# Patient Record
Sex: Male | Born: 1995 | Race: White | Hispanic: No | Marital: Single | Smoking: Never smoker
Health system: Southern US, Community
[De-identification: ages and names within clinical notes are randomized; demographics above are authoritative.]

## PROBLEM LIST (undated history)

## (undated) DIAGNOSIS — F1910 Other psychoactive substance abuse, uncomplicated: Secondary | ICD-10-CM

## (undated) DIAGNOSIS — Q0701 Arnold-Chiari syndrome with spina bifida: Secondary | ICD-10-CM

## (undated) DIAGNOSIS — E119 Type 2 diabetes mellitus without complications: Secondary | ICD-10-CM

## (undated) DIAGNOSIS — F1020 Alcohol dependence, uncomplicated: Secondary | ICD-10-CM

## (undated) HISTORY — PX: TONSILLECTOMY AND ADENOIDECTOMY: SUR1326

## (undated) HISTORY — DX: Arnold-Chiari syndrome with spina bifida: Q07.01

## (undated) HISTORY — DX: Type 2 diabetes mellitus without complications: E11.9

---
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
## 2014-01-03 ENCOUNTER — Other Ambulatory Visit (HOSPITAL_COMMUNITY): Payer: BC Managed Care – PPO | Admitting: Psychology

## 2014-01-03 ENCOUNTER — Ambulatory Visit (INDEPENDENT_AMBULATORY_CARE_PROVIDER_SITE_OTHER): Payer: BC Managed Care – PPO | Admitting: Sports Medicine

## 2014-01-03 ENCOUNTER — Encounter: Payer: Self-pay | Admitting: Sports Medicine

## 2014-01-03 VITALS — BP 113/68 | HR 85 | Ht 68.0 in | Wt 187.0 lb

## 2014-01-03 DIAGNOSIS — F341 Dysthymic disorder: Secondary | ICD-10-CM | POA: Diagnosis not present

## 2014-01-03 DIAGNOSIS — F329 Major depressive disorder, single episode, unspecified: Secondary | ICD-10-CM

## 2014-01-03 DIAGNOSIS — Z111 Encounter for screening for respiratory tuberculosis: Secondary | ICD-10-CM | POA: Diagnosis not present

## 2014-01-03 DIAGNOSIS — Z0100 Encounter for examination of eyes and vision without abnormal findings: Secondary | ICD-10-CM | POA: Diagnosis not present

## 2014-01-03 DIAGNOSIS — Z299 Encounter for prophylactic measures, unspecified: Secondary | ICD-10-CM | POA: Insufficient documentation

## 2014-01-03 DIAGNOSIS — F1120 Opioid dependence, uncomplicated: Secondary | ICD-10-CM | POA: Diagnosis not present

## 2014-01-03 DIAGNOSIS — F32A Depression, unspecified: Secondary | ICD-10-CM

## 2014-01-03 DIAGNOSIS — E1069 Type 1 diabetes mellitus with other specified complication: Secondary | ICD-10-CM

## 2014-01-03 DIAGNOSIS — E109 Type 1 diabetes mellitus without complications: Secondary | ICD-10-CM

## 2014-01-03 DIAGNOSIS — F419 Anxiety disorder, unspecified: Secondary | ICD-10-CM

## 2014-01-03 LAB — POCT URINALYSIS DIPSTICK
Bilirubin, UA: NEGATIVE
Blood, UA: NEGATIVE
Glucose, UA: 500
Ketones, UA: NEGATIVE
Leukocytes, UA: NEGATIVE
Nitrite, UA: NEGATIVE
Protein, UA: NEGATIVE
Spec Grav, UA: 1.02
Urobilinogen, UA: 0.2
pH, UA: 6

## 2014-01-03 NOTE — Assessment & Plan Note (Signed)
Back on insulin pump. Management by endocrinology.

## 2014-01-03 NOTE — Progress Notes (Signed)
  Subjective:    CC: Followup  HPI: Dalton Jackson who likes to be called [name] is a very pleasant 18 year old male, we have been treating him for anxiety and depression with clonazepam. Unfortunately he was recently pulled over and arrested for a DUI, he had been obtaining oxycodone and Opana on the streets. He is now in outpatient intensive rehabilitation for drug and abuse. He is agreeable to seeing our psychiatrist downstairs for further treatment of his depression, he has been on nortriptyline, bupropion, and we tried using around without any improvement. He understands that we will not be prescribing any further controlled substances, and we will be down tapering his clonazepam.  Diabetes mellitus type 1: Back to the insulin pump, managed by endocrinology.  Past medical history, Surgical history, Family history not pertinant except as noted below, Social history, Allergies, and medications have been entered into the medical record, reviewed, and no changes needed.   Review of Systems: No fevers, chills, night sweats, weight loss, chest pain, or shortness of breath.   Objective:    General: Well Developed, well nourished, and in no acute distress.  Neuro: Alert and oriented x3, extra-ocular muscles intact, sensation grossly intact.  HEENT: Normocephalic, atraumatic, pupils equal round reactive to light, neck supple, no masses, no lymphadenopathy, thyroid nonpalpable.  Skin: Warm and dry, no rashes. Cardiac: Regular rate and rhythm, no murmurs rubs or gallops, no lower extremity edema.  Respiratory: Clear to auscultation bilaterally. Not using accessory muscles, speaking in full sentences.  Impression and Recommendations:

## 2014-01-03 NOTE — Assessment & Plan Note (Signed)
Discontinuing clonazepam. Referral downstairs for anxiety/depression in light of recent opiate abuse.

## 2014-01-03 NOTE — Assessment & Plan Note (Addendum)
Forms filled out for school. Starting pharmacy school next month. Urinalysis as required by school form.

## 2014-01-04 ENCOUNTER — Encounter (HOSPITAL_COMMUNITY): Payer: Self-pay | Admitting: Psychology

## 2014-01-04 NOTE — Progress Notes (Signed)
    Daily Group Progress Note  Program: CD-IOP   Group Time: 1-2:30 pm  Participation Level: Active  Behavioral Response: Appropriate and Sharing  Type of Therapy: Process Group  Topic: Process/Psycho-Ed; the first part of group was spent in process. Members shared about the past weekend and events or situations that had challenged their sobriety. After checking-in, they also discussed the things they did to support their recovery. Halfway through the session, an explanation of the 4 elements that are involved in the development of addiction was provided. These are referred to the Bio-Psycho-Social-Spiritual elements. Only 1 of the 4 elements - the biological portion - cannot be changed. the remainder of the session was spent identifying how to address the other 3 elements in a way that supports and promotes sobriety.  Group Time: 2:45- 4pm  Participation Level: Minimal  Behavioral Response: Appropriate  Type of Therapy: Psycho-education Group  Topic: Psycho-Ed: Negative Core Beliefs. The second part of group was spent in a presentation on the development of negative core beliefs. A handout was provided. Members shared how some of their negative core beliefs may have developed. The new group members noted his father was a Programmer, multimediapreacher and a part of him believes it is a choice that he makes to smoke crack. He feels even worse about himself when he thinks that he may just be choosing to smoke versus the lack of control in active addiction. Another member reported she would worry about someone might think negatively of her and not risk certain things as a result of these fears. There was good feedback among members and I reminded them that these beliefs must eventually be identified and addressed because they fuel one's addiction.   Summary: The patient returned to group today after having missed group on Friday. He had attended an orientation at [employer], where he is scheduled to enter  in late August. When asked how the day had been, the patient reported it had been a really fun day. He explained that it had been great having the freedom to be with other young people and he anticipated starting school next month. He reminded the group that he is currently not allowed much freedom by his parents because of the DUI he had 2 weeks ago. He gets rides from his mother or his girlfriend. On Saturday he had attended [place] NA [place] in [place] and Sunday he went to church. 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 He admitted he hasn't really had anything bad happen although other group members disagreed with this observation. The patient reported he had been diagnosed with diabetes almost a year ago. He has an insulin pump as does another group member. The patient shared little of himself in the discussion on core beliefs. In fact, he seems to have a very good sense of self - perhaps a little inflated, to be exact, but we will address this issue in later sessions. In the meantime, the patient's sobriety date remains 7/18.    Family Program: Family present? No   Name of family member(s):   UDS collected: No Results:   AA/NA attended?: YesSaturday  Sponsor?: No, but he has only attended a few sessions   Ellarie Picking, LCAS

## 2014-01-05 ENCOUNTER — Other Ambulatory Visit (HOSPITAL_COMMUNITY): Payer: BC Managed Care – PPO | Admitting: Psychology

## 2014-01-05 DIAGNOSIS — E1069 Type 1 diabetes mellitus with other specified complication: Secondary | ICD-10-CM

## 2014-01-05 DIAGNOSIS — F1120 Opioid dependence, uncomplicated: Secondary | ICD-10-CM | POA: Diagnosis not present

## 2014-01-06 ENCOUNTER — Encounter (HOSPITAL_COMMUNITY): Payer: Self-pay | Admitting: Psychology

## 2014-01-06 LAB — TB SKIN TEST
Induration: 0 mm
TB Skin Test: NEGATIVE

## 2014-01-06 NOTE — Progress Notes (Signed)
    Daily Group Progress Note  Program: CD-IOP   Group Time: 1-2:30 pm  Participation Level: Active  Behavioral Response: Appropriate and Sharing  Type of Therapy: Process Group  Topic: Process/Dry Drunk: the first part of the session today was spent in process and a psycho-ed. Members shared about what they have done since the last group session.  One member checked-in with a new sobriety date and we processed his relapse. The emphasis was not on shaming this member, but providing everyone with further insight into red flags signaling possible relapse. During group today, the Investment banker, operational met with 2 new group members for their initial sessions with him and a discharge. A handout defining a "Dry Drunk" was provided and members read over the handout together. At the conclusion of this part of group, everyone agreed they understood what a dry drunk was.   Group Time: 2:45- 4pm  Participation Level: Active  Behavioral Response: Sharing  Type of Therapy: Activity/Graduation  Topic: Five Love Languages/Graduation: the second half of group was spent in an activity. Members completed a handout and later offered their ranking relative to the well-known book, The Five Love Languages. The importance of identifying and knowing how one most feels loved was emphasized. This is also true for an S/O or close relationships. None of the members had ever done the exercise and they all seemed to find it enlightening. At the conclusion of group a graduation ceremony was held for a member leaving the program today. Kind words of hope and gratitude were shared and the graduating member encouraged them to make the most of the opportunity this program provides.   Summary: The patient reported he had attended a meeting since the last group. His girlfriend had gone to the [place] today and would be gone a week. He admitted he was frustrated because his parents don't let him do anything. They drive  him to meetings or here, but otherwise, he has to stay at his house. Another member referred to his captivity like being in "a cage'. Other members agreed that they couldn't stand that sort of thing and one member, the same age as this young man, admitted he had gotten mad and basically threatened his parents if they didn't give him a little more freedom. This patient reported he couldn't possibly do that to his father. During the conversation on dry drunk, the patient admitted he was "restless, irritable, and discontent", the very words listed on the handout. I encouraged him to speak with his parents and consider a reasonable agreement for both parties. I also agreed to phone his parents and speak with them. The patient seemed pleased to hear this. In the second half of group, the patient identified his love languages as quality time and physical touch. He agreed that he would get his girlfriend to take the test when she returns from vacation. The patient wished the graduating member well and expressed hope that he would see her in meetings in the future. The patient is doing well, but it remains unclear whether he is doing for himself or to please his parents. His sobriety date is 7/18.    Family Program: Family present? No   Name of family member(s):   UDS collected: Yes Results: not back yet  AA/NA attended?: YesTuesday  Sponsor?: No   Juno Bozard, LCAS

## 2014-01-07 ENCOUNTER — Other Ambulatory Visit (HOSPITAL_COMMUNITY): Payer: BC Managed Care – PPO | Admitting: Psychology

## 2014-01-07 DIAGNOSIS — E1069 Type 1 diabetes mellitus with other specified complication: Secondary | ICD-10-CM

## 2014-01-07 DIAGNOSIS — F1120 Opioid dependence, uncomplicated: Secondary | ICD-10-CM | POA: Diagnosis not present

## 2014-01-10 ENCOUNTER — Encounter (HOSPITAL_COMMUNITY): Payer: Self-pay | Admitting: Psychology

## 2014-01-10 ENCOUNTER — Other Ambulatory Visit (HOSPITAL_COMMUNITY): Payer: BC Managed Care – PPO | Attending: Psychiatry | Admitting: Psychology

## 2014-01-10 DIAGNOSIS — Z794 Long term (current) use of insulin: Secondary | ICD-10-CM | POA: Insufficient documentation

## 2014-01-10 DIAGNOSIS — F1010 Alcohol abuse, uncomplicated: Secondary | ICD-10-CM | POA: Insufficient documentation

## 2014-01-10 DIAGNOSIS — E119 Type 2 diabetes mellitus without complications: Secondary | ICD-10-CM | POA: Insufficient documentation

## 2014-01-10 DIAGNOSIS — F1120 Opioid dependence, uncomplicated: Secondary | ICD-10-CM | POA: Diagnosis not present

## 2014-01-10 DIAGNOSIS — F1210 Cannabis abuse, uncomplicated: Secondary | ICD-10-CM | POA: Diagnosis not present

## 2014-01-10 DIAGNOSIS — F341 Dysthymic disorder: Secondary | ICD-10-CM | POA: Insufficient documentation

## 2014-01-10 DIAGNOSIS — E1069 Type 1 diabetes mellitus with other specified complication: Secondary | ICD-10-CM

## 2014-01-10 NOTE — Progress Notes (Signed)
    Daily Group Progress Note  Program: CD-IOP   Group Time: 1-2:30 pm  Participation Level: Active  Behavioral Response: Appropriate and Sharing  Type of Therapy: Process Group  Topic: Process; the first part of group was spent in process. Members shared about current issues and concerns. They also disclosed the things they had done to support their recovery, including meetings and speaking with their sponsor. The importance of attendance and being here for one's self and others was emphasized. This was pointed out in light of a few absences and disappearances by, apparently, "former" group members.   Group Time: 2:45- 4pm  Participation Level: Active  Behavioral Response: Appropriate  Type of Therapy: Psycho-education Group  Topic: Emotional Buttons: the second half of group was spent in a psycho-ed. A handout was provided identifying different types of "buttons" that people might have. All of the 'buttons" listed deal with negative feelings that are generated when the "button" is pushed. The discussion was lively with good feedback and disclosure among group members.   Summary: The patient reported that he had driven to group today. The group applauded this news. He pointed out that the session he and his parents had had with me on Wednesday had been very helpful and got a lot of things out in the open. He had also driven to the [place] meeting, [place], last night. When I asked the group how this fellow could begin to regain his parent's trust, they all agreed that doing what he says he is going to do is essential. If he says he will be home at 11:30 he must be home or call them immediately. The patient seemed to be in total agreement with these observations. The patient reported he had met with a pastor yesterday and talked with him. The pastor had been addicted to cocaine, but has now attained over 20 years of sobriety. Another friend of his father's had driven him to a methadone  clinic and they had just sat outside in the car and watched the coming and goings. It had been eye-opening, according to this patient. In the psycho-ed, the patient identified his biggest button as the "inadequacy button". He admitted he works hard to overcome these feelings. Another member pointed out that he does this frequently in group when he offers complicated obscure information about certain topics, especially as relate to medical or bodily things. The patient also pointed out that "control" is another one of his buttons. He admitted sometimes he tries to get what he knows he cannot get, but if he does get it, he doesn't care about it anymore. The patient was open and talkative today. He was very pleased about this new freedom his parents have given him, but the importance of following through with this new accountability, was emphasized. He is attending meetings and reported he hopes to secure a sponsor this weekend. His sobriety date is 7/18.  Family Program: Family present? No   Name of family member(s):   UDS collected: No Results:  AA/NA attended?: YesWednesday and Thursday  Sponsor?: No, but he has someone in mind to ask   Jaquay Morneault, LCAS

## 2014-01-12 ENCOUNTER — Other Ambulatory Visit (HOSPITAL_COMMUNITY): Payer: BC Managed Care – PPO | Admitting: Psychology

## 2014-01-12 DIAGNOSIS — E1069 Type 1 diabetes mellitus with other specified complication: Secondary | ICD-10-CM

## 2014-01-12 DIAGNOSIS — F1120 Opioid dependence, uncomplicated: Secondary | ICD-10-CM

## 2014-01-13 ENCOUNTER — Encounter (HOSPITAL_COMMUNITY): Payer: Self-pay | Admitting: Psychology

## 2014-01-13 NOTE — Progress Notes (Signed)
    Daily Group Progress Note  Program: CD-IOP   Group Time: 1-2:30 pm  Participation Level: Active  Behavioral Response: Appropriate and Sharing  Type of Therapy: Process Group  Topic: Process: the first part of group was spent in process. Members shared about what had occurred since we last met. During check-in, a patient identified a new sobriety date. This relapse was explored further with good feedback and discussion among group members. During this half of group, one member admitted another member's comments were very frustrating and really bothering her. I applauded her willingness to be honest - he had made distracting comments and interrupted repeatedly. The group discussed his behaviors, but whether he 'heard' these observations, remains to be seen.   Group Time: 2:45- 4pm  Participation Level: Active  Behavioral Response: Sharing  Type of Therapy: Psycho-education Group  Topic: Healthy Boundaries: the second half of group was spent in a psycho-ed. The topic of the presentation was on establishing healthy boundaries. These are particularly important in early recovery, when whatever boundaries one might have had in sobriety were abandoned with active addiction. There was frank disclosures and group members shared openly about their regrets of the past. At the end of the session, two members were asked to 'teach back'. Both made good comments and displayed a good understanding of the presentation.   Summary: The patient checked-in with a new sobriety date. He reported a date of 7/13. Members picked up on this discrepancy very quickly. When asked to disclose what had happened, the patient reported he had had a beer on Sunday evening. He admitted he hadn't shared this with the group on Monday, but realized he had to be honest today. The group processed this relapse and there were some details that were troubling. For example, the woman whose home he was in had not been told by him  of his recovery status. Instead of refusing the offer, he accepted the beer easily. Other members provided feedback and most of their comments reflected their unwillingness to be around other people that either aren't in recovery or don't know they are. The patient took the comments well and agreed he could have handled it differently. In the psycho-ed, the patient provided an exampled of a previous relationship where they were very close and then breaking up. Then the sequence would be repeated over and over again. The patient asked about some other earlier meetings and another group member invited him to meet him at his noon NA meeting tomorrow. I had challenged him and pointed out he was going to meetings late - 10 or 11 pm - and I wondered what he does all day. The patient really doesn't do much of anything, but sleep and sit around the house. He does not seem fully invested into some of the basic recovery concepts and his sobriety date is new with a 7/13 date. A specimen was collected from this patient today.    Family Program: Family present? No   Name of family member(s):   UDS collected: Yes Results: not back yet  AA/NA attended?: [place] and Tuesday  Sponsor?: No   Brynn Mulgrew, LCAS

## 2014-01-14 ENCOUNTER — Other Ambulatory Visit (HOSPITAL_COMMUNITY): Payer: BC Managed Care – PPO | Admitting: Psychology

## 2014-01-14 DIAGNOSIS — F1120 Opioid dependence, uncomplicated: Secondary | ICD-10-CM | POA: Diagnosis not present

## 2014-01-14 DIAGNOSIS — F32A Depression, unspecified: Secondary | ICD-10-CM

## 2014-01-14 DIAGNOSIS — E1069 Type 1 diabetes mellitus with other specified complication: Secondary | ICD-10-CM

## 2014-01-14 DIAGNOSIS — F411 Generalized anxiety disorder: Secondary | ICD-10-CM

## 2014-01-14 DIAGNOSIS — F329 Major depressive disorder, single episode, unspecified: Secondary | ICD-10-CM

## 2014-01-14 MED ORDER — ESCITALOPRAM OXALATE 10 MG PO TABS: 10.0000 mg | ORAL_TABLET | Freq: Every day | ORAL | Status: DC

## 2014-01-14 MED ORDER — PROPRANOLOL HCL 10 MG PO TABS: 10.0000 mg | ORAL_TABLET | Freq: Two times a day (BID) | ORAL | Status: DC

## 2014-01-14 NOTE — Progress Notes (Signed)
Patient ID: Dalton HeinzJonathon G Jackson, male   DOB: 04-22-1996, 18 y.o.   MRN: [id number]  S-Pt seeking to try medications originally discussed on intake.No longer expressing desire to pursue natural substitutes.State he spoke with his GF who is a Teacher, early years/prepharmacist and is willing to try meds. O Alert Oriented Appropriate Comprehensible Limited insight A- Hx of depression with anxiety prior to substance abuse/chemical dependency P-Rx Inderal 10 mg and Lexapro 10 mg done electronically and script written as backup

## 2014-01-15 ENCOUNTER — Encounter (HOSPITAL_COMMUNITY): Payer: Self-pay | Admitting: Psychology

## 2014-01-15 NOTE — Progress Notes (Signed)
    Daily Group Progress Note  Program: CD-IOP   Group Time: 1-2:30 pm  Participation Level: Active  Behavioral Response: Appropriate and Sharing  Type of Therapy: Process Group  Topic: Process/Psycho-Ed: the first half of group was spent in process with members updating the group on any events or issues that have appeared over the weekend One member shared about the session with his S/O and myself earlier this morning. A new member was present and she shared a little about herself. The session continued with a slide show from the [employer] on "Triggers and Cravings". the presentation educated members on how triggers are developed as the addiction strengthens. There was good feedback and discussion during the show.  Group Time: 2:45-4pm  Participation Level: Active  Behavioral Response: Sharing  Type of Therapy: Psycho-education Group  Topic: Deactivating Cravings: a handout was provided as a follow up to the slide show completed in the first part of group. A scenario about a fellow who had relapsed over the course of the day was read by members. The group was challenged to identify how and where this character had gone wrong. Members displayed good insight and understanding of the development of cravings. The discussion included examples from their own lives and past drug use and relapse.    Summary: The patient reported he was doing well. He had had a good weekend and had attended 3 meetings. He hadn't seen the guy he wanted for a sponsor, but he insisted he was going to get one soon. He had also seen his giflfriend since she had left on vacation. The patient made some good comments during the slide show and could easily relate to the growing preoccupation with the drug as his use increased. He admitted that nothing else mattered. In the psycho-ed, the patient reported that the fellow in the handout had clearly been setting up a relapse. He noted all of the different things  he did and it seemed pretty obvious from his perspective. The patient continues to do everything we have asked of him and his sobriety date remains 7/18.  Family Program: Family present? No   Name of family member(s):   UDS collected: Yes Results: the drug test was defective and could not be tested.  AA/NA attended?: YesFriday, Saturday and Sunday  Sponsor?: No, but he says he is looking for one and has a sponsor in mind   Philipe Laswell, LCAS

## 2014-01-16 ENCOUNTER — Encounter (HOSPITAL_COMMUNITY): Payer: Self-pay | Admitting: Psychology

## 2014-01-16 NOTE — Progress Notes (Signed)
    Daily Group Progress Note  Program: CD-IOP   Group Time: 1-2:30 pm  Participation Level: Active  Behavioral Response: Appropriate and Sharing  Type of Therapy: Psycho-education Group  Topic: Pharmacist Visit: the first half of group was spent in a psycho-ed with a guest speaker. The guest was the pharmacist, EP, who dispense medications upstairs in the residential unit of [facility]. She described the effects that different drugs have on the body. The guest also educated on the medications used to address various psychiatric conditions and fielded numerous questions from the group members. During this time, the medical director, CK, was meeting with new group members and current members with medication questions or concerns.   Group Time: 2:45-4pm  Participation Level: Active  Behavioral Response: Sharing, Grandiose, Resistant, Suspicious and Minimizing  Type of Therapy: Process Group  Topic: Process: the second half of group was spent in process. Members shared about current issues or concerns. A new member, who had returned after missing the last group session, was tearful as she recounted the events leading up to her relapse. Members were attentive and supportive, but they also reminded her that her sobriety must come first.   Summary: The patient was very attentive and asked numerous questions of the pharmacist. He hopes to become a pharmacist one day and displays knowledge of receptors and certain chemical reactions that others present have no knowledge of. During the session, the patient was able to meet with the medical director. He had previously refused any mediation assistance, but had asked to speak with the director because he does want to get some help. In process, the patient reported he had attended 5 meetings since the last group session, including 3 meetings yesterday. He had enjoyed the 'Advertising account executive' NA meeting and had met with another group member who had introduced  him to some new people he didn't know.  I asked the group how they had interpreted his disclosure in group on Wednesday about having had a beer on Sunday? He had suggested to the group that he had told his parents that he had had a beer on Sunday evening. Other members agreed that this is how they had interpreted his disclosure. They were surprised when I noted he had not told them or his girlfriend about the beer. His father had found out somehow. I questioned the patient's continued efforts to deny or failure to disclose the "entire" truth. His lack of honesty and transparency is very troubling to the treatment team and should be to him. The patient seemed non-plussed by my comments, but we hope he will think more deeply about this lack of transparency. The patient sobriety date is 8/3. A drug test was collected because the one collected on Monday had come back untestable. It must have spilled out. We will follow closely in the days ahead.   Family Program: Family present? No   Name of family member(s):   UDS collected: Yes Results: not back from lab  AA/NA attended?: YesWednesday and Thursday  Sponsor?: Yes, the patient reported he has secured a sponsor   Dayquan Buys, LCAS

## 2014-01-17 ENCOUNTER — Other Ambulatory Visit (HOSPITAL_COMMUNITY): Payer: BC Managed Care – PPO | Admitting: Psychology

## 2014-01-17 DIAGNOSIS — F1120 Opioid dependence, uncomplicated: Secondary | ICD-10-CM | POA: Diagnosis not present

## 2014-01-17 DIAGNOSIS — E1069 Type 1 diabetes mellitus with other specified complication: Secondary | ICD-10-CM

## 2014-01-18 ENCOUNTER — Encounter (HOSPITAL_COMMUNITY): Payer: Self-pay | Admitting: Psychology

## 2014-01-18 NOTE — Progress Notes (Signed)
    Daily Group Progress Note  Program: CD-IOP   Group Time: 1-2:30 pm  Participation Level: Active  Behavioral Response: Sharing  Type of Therapy: Process Group  Topic: Process: the first part of group was spent in process. Members shared bout the past weekend. They disclosed about the meetings they had attended and other activities that have supported their recovery as well as any challenges or temptations that may have presented themselves. One member had relapsed and she was asked to share the events that had led to her drinking. She did not respond to my request - I reminded group members that the sheer act of disclosing and speaking one's experience is therapeutic and, ultimately, healing.  Also present were 3 new group members. Each of the new members was invited to share a little bit about themselves with their new group members.   Group Time: 2:45- 4pm  Participation Level: Active  Behavioral Response: Appropriate and Sharing  Type of Therapy: Psycho-education Group  Topic: Cognitive Distortions: the second half of group was spent in a psycho-ed. Members were provided with a handout that identified different types of cognitive distortions. We read over the handout together and members commented on some of the distortions that they most frequently used. There was a good discussion and by sharing, members became more open and known among each other. During this group session, random drug tests were collected   Summary: The patient reported a good meeting. He had gone to 2 meetings on Saturday and Sunday evenings. He reported having spent time with his family and he went to church on Sunday. He met with the program director last Friday, but he hadn't filled either of the scripts yet. I was surprised to hear this knowing how compulsive this young man was. The patient stated he was going to begin taking a beta-blocker and an anti-depressant.  He reported things were going okay with  his parents. The patient was open about his cognitive distortions and identified personalization and fortune telling as two of his bigger distortions. The patient provided feedback to his fellow group members and shares fairly openly in group, but I question the depth or accuracy of his disclosures. He remains compliant relative to the program requirements, including a sponsor, and his sobriety date remains 8/3.   Family Program: Family present? No   Name of family member(s):   UDS collected: Yes Results:not back yet  AA/NA attended?: YesFriday and Saturday  Sponsor?: Yes, he reported that he had secured a sponsor   Julietta Batterman, LCAS

## 2014-01-19 ENCOUNTER — Other Ambulatory Visit (HOSPITAL_COMMUNITY): Payer: BC Managed Care – PPO | Admitting: Psychology

## 2014-01-19 ENCOUNTER — Telehealth: Payer: Self-pay

## 2014-01-19 DIAGNOSIS — F1120 Opioid dependence, uncomplicated: Secondary | ICD-10-CM | POA: Diagnosis not present

## 2014-01-19 NOTE — Telephone Encounter (Signed)
Referral was placed on January 03, 2014, I don't see that this has been addressed, please look into this, call downstairs please.

## 2014-01-19 NOTE — Telephone Encounter (Signed)
Mother called stated that patient was supposed to have gotten a referral for Phychiatrist and they have note heard anything yet. Patient mother request a referral and to be contacted. Esmerelda Finnigan,CMA

## 2014-01-20 NOTE — Telephone Encounter (Signed)
spoke to patient mother and gave her the number to Phychiatrist

## 2014-01-21 ENCOUNTER — Other Ambulatory Visit (HOSPITAL_COMMUNITY): Payer: BC Managed Care – PPO

## 2014-01-21 ENCOUNTER — Ambulatory Visit: Payer: Self-pay | Admitting: Sports Medicine

## 2014-01-21 DIAGNOSIS — Z0289 Encounter for other administrative examinations: Secondary | ICD-10-CM

## 2014-01-24 ENCOUNTER — Other Ambulatory Visit (HOSPITAL_COMMUNITY): Payer: BC Managed Care – PPO

## 2014-01-25 ENCOUNTER — Encounter (HOSPITAL_COMMUNITY): Payer: Self-pay | Admitting: Psychology

## 2014-01-25 NOTE — Progress Notes (Incomplete)
    Daily Group Progress Note  Program: CD-IOP   Group Time: 1-2:30  Participation Level: {CHL AMB BH Group Participation:21022742}  Behavioral Response: {CHL AMB BH Group Behavior:21022743}  Type of Therapy: {CHL AMB BH Type of Therapy:21022741}  Topic: ***     Group Time: ***  Participation Level: {CHL AMB BH Group Participation:21022742}  Behavioral Response: {CHL AMB BH Group Behavior:21022743}  Type of Therapy: {CHL AMB BH Type of Therapy:21022741}  Topic: ***   Summary: ***   Family Program: Family present? {BHH YES OR NO:22294}   Name of family member(s): ***  UDS collected: {BHH YES OR NO:22294} Results: {Findings; urine drug screen:60936}  AA/NA attended?: {BHH YES OR NO:22294}{DAYS OF ZOXW:96045}WEEK:22385}  Sponsor?: {BHH YES OR WU:98119}O:22294}   [employer]

## 2014-01-25 NOTE — Progress Notes (Signed)
Daily Group Progress Note  Program: CD-IOP   Group Time: 1-2:30 pm  Participation Level: Active  Behavioral Response: Appropriate and Sharing  Type of Therapy: Psycho-education Group  Topic: Chaplain: the first half of group was spent in a psycho-ed with a guest speaker. PW, a Chaplain with [facility] appeared and led a discussion on "Spirituality". The emphasis was on being present, acceptance and forgiveness. Members shared about their experiences and beliefs. When PW asked the group what they needed, a number of members agreed that they weren't sure that 'answers' would provide them with what they really needed. The session generated even more questions then before the session. During group today, the medical director, CK, met with 2 new group members and provided refills where needed.   Group Time: 2:45- 4pm  Participation Level: Active  Behavioral Response: Sharing  Type of Therapy: Process Group  Topic: Process: the second half of group was spent in process. Members shared about the past few days and any concerns or issues that may have challenged or questioned their sobriety. No one had relapsed since the last session. One member had not appeared yesterday for our individual appointment and I questioned why she had called and cancelled. She admitted her boyfriend had told her not to go. The group responded with disbelief. The importance of making choices and decisions in early recovery was emphasized. I also reminded members that if they didn't make the choices that were needed, others would make them for them. I disclosed that a guest facilitator would be present for the next two sessions and encouraged the group to engage as they always do.   Summary:The patient was attentive and described himself as religious. He explained, though, that he did not differentiate between religion and spirituality. Other members agreed with this observation. The patient agreed with another  member that he had been raised within a religion that discouraged him from questioning when bad things happened. He was told "not to challenge God's will'. The patient was engaged in process and shared about his increased attendance in meetings. He has been going to the last two meetings at [facility]. When I challenged him about having maintained his religious self during this addiction, the patient admitted that it wasn't his religion that had let him down, but rather that he had strayed from his religion and religious beliefs. 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 The patient's sobriety date is 8/3.     Family Program: Family present? No   Name of family member(s):   UDS collected: No Results:   AA/NA attended?: YesMonday and Tuesday  Sponsor?: Yes   Dalton Jackson, LCAS

## 2014-01-26 ENCOUNTER — Other Ambulatory Visit (HOSPITAL_COMMUNITY): Payer: BC Managed Care – PPO | Admitting: Psychology

## 2014-01-26 ENCOUNTER — Telehealth (HOSPITAL_COMMUNITY): Payer: Self-pay | Admitting: Psychology

## 2014-01-26 DIAGNOSIS — F1120 Opioid dependence, uncomplicated: Secondary | ICD-10-CM

## 2014-01-26 DIAGNOSIS — E1069 Type 1 diabetes mellitus with other specified complication: Secondary | ICD-10-CM
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
## 2014-01-27 ENCOUNTER — Encounter (HOSPITAL_COMMUNITY): Payer: Self-pay | Admitting: Psychology
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
## 2014-01-28 ENCOUNTER — Other Ambulatory Visit (HOSPITAL_COMMUNITY): Payer: BC Managed Care – PPO

## 2014-01-31 ENCOUNTER — Other Ambulatory Visit (HOSPITAL_COMMUNITY): Payer: BC Managed Care – PPO

## 2014-02-02 ENCOUNTER — Other Ambulatory Visit (HOSPITAL_COMMUNITY): Payer: BC Managed Care – PPO

## 2014-02-03 ENCOUNTER — Telehealth (HOSPITAL_COMMUNITY): Payer: Self-pay | Admitting: Psychology

## 2014-02-04 ENCOUNTER — Other Ambulatory Visit (HOSPITAL_COMMUNITY): Payer: BC Managed Care – PPO | Admitting: Psychology

## 2014-02-04 DIAGNOSIS — F1120 Opioid dependence, uncomplicated: Secondary | ICD-10-CM | POA: Diagnosis not present

## 2014-02-07 ENCOUNTER — Other Ambulatory Visit (HOSPITAL_COMMUNITY): Payer: BC Managed Care – PPO

## 2014-02-09 ENCOUNTER — Other Ambulatory Visit (HOSPITAL_COMMUNITY): Payer: BC Managed Care – PPO | Attending: Psychiatry

## 2014-02-09 DIAGNOSIS — F1210 Cannabis abuse, uncomplicated: Secondary | ICD-10-CM | POA: Insufficient documentation

## 2014-02-09 DIAGNOSIS — E119 Type 2 diabetes mellitus without complications: Secondary | ICD-10-CM | POA: Insufficient documentation

## 2014-02-09 DIAGNOSIS — F1010 Alcohol abuse, uncomplicated: Secondary | ICD-10-CM | POA: Insufficient documentation

## 2014-02-09 DIAGNOSIS — F1120 Opioid dependence, uncomplicated: Secondary | ICD-10-CM | POA: Insufficient documentation

## 2014-02-10 ENCOUNTER — Encounter (HOSPITAL_COMMUNITY): Payer: Self-pay | Admitting: Psychology

## 2014-02-10 NOTE — Progress Notes (Signed)
    Daily Group Progress Note  Program: CD-IOP   Group Time: 1-2:30 pm  Participation Level: Active  Behavioral Response: Appropriate and Sharing  Type of Therapy: Process Group/Activity  Topic: Group Process/Progressive Relaxation Activity: the first part of group was spent in process. Members shared about current issues and concerns in early recovery. One member became resistant and irritated when challenged about lying late last week. Another member sided with her and reported he thought it was 'ridiculous' to be discussing these behaviors. Other group members appeared somewhat uncomfortable with the interactions, but encouraged the member to review her past behaviors more objectively. A guided progressive relaxation exercise was read with members closing their eyes and following the words. Most of the group members later reported enjoying the guided relaxation activity.  Group Time: 2:45-4pm  Participation Level: Active  Behavioral Response: Appropriate and Sharing  Type of Therapy: Process Group  Topic: Movie - "Flight/Family Sculpture: the second part of the session opened with film clips from the movie "flight". The opening scene was watched and then discussed. Members identified the egotism of the leading character along with his quick anger and irritability. All agreed there was certainly no humility and lying was his norm. Later on, a member sculpted her family. There was good feedback among members as they discussed this woman's family sculpture. The session was powerful, but the two members who had become resistant remained distant throughout the remainder of the session and passive-aggressive behaviors were displayed during this session.   Summary: The patient appeared in group today for the first time since last Wednesday. His absences had not been anticipated. When asked about his first week of college, he noted that things were very different than high school and he is  really enjoying the freedom and independence. Classes began on Tuesday and he is excited about being there. He reminded another member that when I had called him out, it was because he was doing something that he needed to address. He was encouraging to the group member, but they did not respond to his supportive comments. The patient played a role in the family sculpture and shared his feelings about this role. It was good to have this group member back today, but having missed the last 3 group sessions, he was a little out of the loop in what has happened since he was last here. The patient's sobriety date is 8/3.    Family Program: Family present? No   Name of family member(s):   UDS collected: Yes Results: Negative  AA/NA attended?: Yes  Sponsor?: Yes   Jshawn Hurta, LCAS

## 2014-02-11 ENCOUNTER — Other Ambulatory Visit (HOSPITAL_COMMUNITY): Payer: BC Managed Care – PPO | Admitting: Psychology

## 2014-02-11 DIAGNOSIS — F1120 Opioid dependence, uncomplicated: Secondary | ICD-10-CM | POA: Diagnosis not present

## 2014-02-11 DIAGNOSIS — E1069 Type 1 diabetes mellitus with other specified complication: Secondary | ICD-10-CM

## 2014-02-11 DIAGNOSIS — E119 Type 2 diabetes mellitus without complications: Secondary | ICD-10-CM | POA: Diagnosis not present

## 2014-02-11 DIAGNOSIS — F1210 Cannabis abuse, uncomplicated: Secondary | ICD-10-CM | POA: Diagnosis not present

## 2014-02-11 DIAGNOSIS — F1010 Alcohol abuse, uncomplicated: Secondary | ICD-10-CM | POA: Diagnosis not present

## 2014-02-16 ENCOUNTER — Other Ambulatory Visit (HOSPITAL_COMMUNITY): Payer: BC Managed Care – PPO | Admitting: Psychology

## 2014-02-16 DIAGNOSIS — F1120 Opioid dependence, uncomplicated: Secondary | ICD-10-CM | POA: Diagnosis not present

## 2014-02-18 ENCOUNTER — Other Ambulatory Visit (HOSPITAL_COMMUNITY): Payer: BC Managed Care – PPO | Admitting: Psychology

## 2014-02-18 DIAGNOSIS — E1069 Type 1 diabetes mellitus with other specified complication: Secondary | ICD-10-CM

## 2014-02-18 DIAGNOSIS — F1120 Opioid dependence, uncomplicated: Secondary | ICD-10-CM | POA: Diagnosis not present

## 2014-02-21 ENCOUNTER — Encounter (HOSPITAL_COMMUNITY): Payer: Self-pay | Admitting: Psychology

## 2014-02-21 ENCOUNTER — Other Ambulatory Visit (HOSPITAL_COMMUNITY): Payer: BC Managed Care – PPO

## 2014-02-21 NOTE — Progress Notes (Signed)
    Daily Group Progress Note  Program: CD-IOP   Group Time: 1-2:30 pm  Participation Level: Active  Behavioral Response: Appropriate and Sharing  Type of Therapy: Process Group  Topic: Group Process: the first part of group was spent in process. Members shared about current issues and concerns. One member arrived late and made some excuses about his 74 minute late arrival. Another member called him out on his "bullshit". In turn, she was questioned by fellow group members since she has never spoken out. They wondered what was going on with her? She also had many excuses and the group didn't appear to buy into her explanations. During this session, the program director met with new members as well as ones discharging.   Group Time: 2:45- 4pm  Participation Level: Active  Behavioral Response: Sharing  Type of Therapy: Psycho-education Group  Topic: Relapse Warning Signs/"Step One" Reading/Graduation: the second half of group was spent in a psycho-ed on relapse warning signs. A handout was provided with members identifying those listed. Members identified their relapse triggers and discussed them. A member read her "Step One" handout. It was a very poignant reading and she cried as she recounted the damage from her alcohol use. The session ended with two graduations. There was cake and a pie to celebrate and kind words shared by all.   Summary: The patient appeared very excited and pleased with himself. He reported that he had achieved 30 days of sobriety yesterday. He had not picked up his chip yet, but planned on getting it tonight at [employer]. When asked about alcohol and marijuana, the patient admitted there is alcohol around his campus, but he had not seen any weed since arriving there a couple of weeks ago. He reported there is an [place] meeting in [place], but he hadn't been there yet. He was questioned about having attended the minimum number of meetings, but he deflected the question.  The patient had kind words to share with the two graduating members. He admitted that they both have served as inspirations and motivators for him during this program. The patient reported he will stay in the area tonight and his high school has a Training and development officer. He provided good feedback and appears to be making good progress in his recovery, but it remains to be seen whether he is working and program while recently beginning his freshman year at [employer]. The patient's sobriety date is 8/3.    Family Program: Family present? No   Name of family member(s):   UDS collected: No Results:   AA/NA attended?: Yes  Sponsor?: Yes   Josuha Fontanez, LCAS

## 2014-02-21 NOTE — Progress Notes (Unsigned)
Dalton Jackson is a 18 y.o. male patient. CD-IOP: The patient arrived in group today 15 minutes late. He grabbed the garbage can as he made his way to his seat. He was very pale and admitted he had woken up this morning feeling terrible and had spent a lot of time in the bathroom. I explained that if he was feeling badly with some sort of stomach bug, it would be best if he leave the group, go home and sleep. It was not appropriate for him to be in group with something he could give to others. The patient was resistant and wondered what his parents would say? I assured him that I would phone them and let them know he was excused from group today. He left as I requested and this will go down as an excused absence.        [name], LCAS

## 2014-02-22 ENCOUNTER — Encounter (HOSPITAL_COMMUNITY): Payer: Self-pay | Admitting: Psychology

## 2014-02-22 NOTE — Progress Notes (Signed)
    Daily Group Progress Note  Program: CD-IOP   Group Time: 1-2:30 pm  Participation Level: Active  Behavioral Response: Sharing  Type of Therapy: Psycho-education Group  Topic: - After checking in, the group gave one member who had relapsed some time to talk about her feelings and what may have contributed to her relapse.  The group was very encouraging to her and helped her realize that she had not let anyone in the group down.  The chaplain also came in during this part of group and provided some psychoeducation on anger.  The group discussed their views on what anger is, and also discussed some quotes that resonated with them.  Group Time: 2:45- 4pm  Participation Level: Active  Behavioral Response: Appropriate  Type of Therapy: Process Group  Topic: During the second part of the session, the group continued to process what had been going on for them since our last meeting and checked in about how the holiday weekend was.  New members introduced themselves, and the group gave them some feedback on some good meetings to go to in the area.  Individual group members' triggers and solutions were reviewed and drug tests were collected today as well.  Summary: The patient was 15 minutes late to group today, but was active during discussion.  He stated that anger usually leads him to make irrational and impulsive decisions.  The patient shared some of his triggers with the group and ways that he could handle them without using.  Interventions are proving effective and the patient's sobriety date is 8/3.   Family Program: Family present? No   Name of family member(s):   UDS collected: No Results:   AA/NA attended?: Yes  Sponsor?: Yes   Yara Tomkinson, LCAS

## 2014-02-23 ENCOUNTER — Other Ambulatory Visit (HOSPITAL_COMMUNITY): Payer: BC Managed Care – PPO

## 2014-02-25 ENCOUNTER — Other Ambulatory Visit (HOSPITAL_COMMUNITY): Payer: BC Managed Care – PPO | Admitting: Psychology

## 2014-02-25 DIAGNOSIS — F1120 Opioid dependence, uncomplicated: Secondary | ICD-10-CM | POA: Diagnosis not present

## 2014-02-26 ENCOUNTER — Encounter (HOSPITAL_COMMUNITY): Payer: Self-pay | Admitting: Psychology

## 2014-02-26 NOTE — Progress Notes (Signed)
    Daily Group Progress Note  Program: CD-IOP   Group Time: 1-2:30 pm  Participation Level: Active  Behavioral Response: Appropriate and Sharing  Type of Therapy: Process Group/Psycho-Ed  Topic: After checking in, patients spent the first part of group sharing what had been going on since our last group meeting.  After processing, the group continued discussing the topic of anger, which began during our last group meeting.  The group identified the physiological sensations that they feel when they become angry and became more aware of their anger warning signs.  Drug tests were returned and group members asked questions about their results as needed.  Group Time: 2:45- 4pm  Participation Level: Active  Behavioral Response: Sharing  Type of Therapy: Psycho-education Group  Topic: Emotional Regulation: During the second part of group, patients reviewed an emotion regulation handout that identified different ways of experiencing and expressing anger.  Patients were able to connect the topics covered to their daily lives and were able to discuss ways of appropriately handling their feelings of anger towards other and towards themselves.  Summary: The patient reported that he was in pain because he had dislocated his shoulder while boxing in his dorm with friends.  He went to a meeting on Thursday and is going to look for some to attend while he is in [place] this weekend.  The patient was very active during the discussion on anger, and described what he called his "opiate rage".  He was referring to the effect that pills have on his expression of anger.  The patient gave great feedback to other members and appears to be doing very well in early recovery.  The intervention proved effective. His sobriety date remains 8/3.  Family Program: Family present? No   Name of family member(s):   UDS collected: No Results:   AA/NA attended?: YesThursday  Sponsor?: Yes   Sumeet Geter,  LCAS

## 2014-02-28 ENCOUNTER — Other Ambulatory Visit (HOSPITAL_COMMUNITY): Payer: BC Managed Care – PPO | Admitting: Psychology

## 2014-02-28 DIAGNOSIS — F1120 Opioid dependence, uncomplicated: Secondary | ICD-10-CM | POA: Diagnosis not present

## 2014-02-28 DIAGNOSIS — E1069 Type 1 diabetes mellitus with other specified complication: Secondary | ICD-10-CM

## 2014-03-02 ENCOUNTER — Encounter (HOSPITAL_COMMUNITY): Payer: Self-pay | Admitting: Psychology

## 2014-03-02 ENCOUNTER — Other Ambulatory Visit (HOSPITAL_COMMUNITY): Payer: BC Managed Care – PPO | Admitting: Psychology

## 2014-03-02 DIAGNOSIS — E1069 Type 1 diabetes mellitus with other specified complication: Secondary | ICD-10-CM

## 2014-03-02 DIAGNOSIS — F1120 Opioid dependence, uncomplicated: Secondary | ICD-10-CM

## 2014-03-02 NOTE — Progress Notes (Signed)
    Daily Group Progress Note  Program: CD-IOP   Group Time: 1-2:30 pm  Participation Level: Active  Behavioral Response: Sharing  Type of Therapy: Process Group  Topic: After checking in, group members each shared what had happened and been going on for them since the last group meeting.  Two patients admitted they had relapsed and were able to receive support and encouragement from other group members.  Psychoeducation on the process of relapse was shared, and group members shared their own triggers, as well as things they could do to keep their thoughts from turning into cravings that would lead to using.  Drug test results were returned and questions about results were asked.  Group Time: 2:45- 4pm  Participation Level: Active  Behavioral Response: Appropriate and Sharing  Type of Therapy: Psycho-education Group  Topic: The second part of group was spent discussing the topic of communication.  The group received a handout outlining the four styles of communication, and each could identify for themselves what type of communication they used most often.  Assertive communication was discussed and the conversation will continue during the next group.  A drug test was collected from a patient who was absent last group.  Summary: The patient stated that his parents have been much more relaxed since he has been sober and that makes him less stressed.  Right now the patient is stressed about school and tests coming up.  He discussed his transition to college life and that he needs structure to be less stressed.  The patient reported that he had found some pain pills in his friend's dorm room and immediately put them down and left the situation so he would not be tempted to use.  He was proud that he stayed sober and did not betray his friend's trust by taking his pills.  He has not attended any meetings and still does not have a sponsor.  However, he stated that he is looking for a temporary  sponsor and is doing step work.  He stated that he is feeling better physically, but still has a chest cold.  Interventions are proving effective and the patient's sobriety date remains 8/3.  Family Program: Family present? No   Name of family member(s):   UDS collected: No Results:   AA/NA attended?: YesWednesday  Sponsor?: Yes, but the sponsor is here in [place] and patient is living near [place], [place]   [place], LCAS

## 2014-03-03 ENCOUNTER — Encounter (HOSPITAL_COMMUNITY): Payer: Self-pay | Admitting: Psychology

## 2014-03-03 NOTE — Progress Notes (Signed)
    Daily Group Progress Note  Program: CD-IOP   Group Time: 1-2:30 pm  Participation Level: Active  Behavioral Response: Appropriate, sharing  Type of Therapy: Process Group  Topic: After checking in, group members shared what had been going on since our last group meeting, including issues that had come up and meetings they had attended.  Two group members admitted that they had relapsed over the weekend. They were able to process their triggers and feelings related to the relapse.  There was one new group member present who took time to introduce himself and explain what has brought him to our program.  Patients discussed the Feelings Wheel worksheet and identified what they were currently feeling in group.  They were encouraged to keep a copy of the worksheet handy to help them identify their feelings outside of group.  Group members were also informed of a few patients who had been discharged from the program. Drug tests were collected from all group members.  Group Time: 2:45- 4pm  Participation Level: Active  Behavioral Response: Sharing  Type of Therapy: Psycho-education Group  Topic: The second part of group was spent continuing our discussion about communication.  The four styles of communication learned in the last group session were reviewed, and assertive communication was discussed in more detail.  Patients discussed their difficulties with being assertive in their lives, and were able to review different refusal skills, ways to say no, ways to express their anger or hurt, and ways to accept compliments from others.  Drug tests were collected in group today, and two patients received drug test results.   Summary: The patient arrived late due to traffic, but stated that he had a good weekend.  He reported attending two meetings and that he had been trying some other meetings to find one where he connected with people more.  Most of the meetings he has attended in [place], [place]  have a much older crowd. The patient shared that he had found a new refusal strategy to use when he was offered alcohol at parties.  The strategy is that he puts dip in his mouth and then people will not pester him about not drinking.  The patient stated that he is stressed about a Biology test and Chemistry lab practical that he has tomorrow.  He also reported that his friend passed away in a car accident over the weekend and that he was very sad about that.  During the psychoeducation portion of the group, the patient stated he was feeling anxious, sad, optimistic, and overwhelmed. The patient was active during group discussion and made some good comments and provided positive feedback to his fellow group members.  The patient continues to make good progress and he responded well to this intervention. His sobriety date remains 8/3.   Family Program: Family present? No   Name of family member(s):   UDS collected: Yes Results: negative  AA/NA attended?: YesFriday and Saturday  Sponsor?: Yes   Jaylenn Baiza, LCAS

## 2014-03-04 ENCOUNTER — Encounter (HOSPITAL_COMMUNITY): Payer: Self-pay | Admitting: Psychology

## 2014-03-04 ENCOUNTER — Other Ambulatory Visit (HOSPITAL_COMMUNITY): Payer: BC Managed Care – PPO | Admitting: Psychology

## 2014-03-04 DIAGNOSIS — E1069 Type 1 diabetes mellitus with other specified complication: Secondary | ICD-10-CM

## 2014-03-04 DIAGNOSIS — F1120 Opioid dependence, uncomplicated: Secondary | ICD-10-CM | POA: Diagnosis not present

## 2014-03-04 NOTE — Progress Notes (Signed)
    Daily Group Progress Note  Program: CD-IOP   Group Time: 1-2:30 pm  Participation Level: Active  Behavioral Response: Appropriate and Sharing  Type of Therapy: Process Group  Topic: After checking in, the first part of group was spent sharing what had been going on for group members since our last meeting.  Group members checked in about meetings they had been going to, challenges they had faced, and provided feedback for other members.  The group then transitioned into psychoeducation about boundaries.  The concept of boundaries and why they are important was discussed.  Drug tests were also returned to patients today.  Group Time: 2:45- 4pm  Participation Level: Active  Behavioral Response: Sharing  Type of Therapy: Psycho-education Group  Topic: The second part of group was spent finishing the conversation about boundaries, and ended with a group member's graduation from the program.  Group members identified why they are afraid of setting boundaries, and were able to talk about people in their lives they need to set boundaries with.  Prior to the graduation, the counselor provided information about dopamine, its role in the brain, and how addiction affects dopamine levels in the brain.  Psychoeducation on boundaries will continue in the next group meeting.  Summary: The patient reported that he decided to drop his biology course, which has provided him with a lot of relief.  Instead, he will be taking a mountain biking class for a P.E. credit. The group laughed with this news. The patient also reported that he had received a grade he was happy with on his lab practical and that he has a job interview scheduled for this afternoon.  The patient stated that he had attended a new meeting in [place] that he really liked.  He also expressed his interest in asking about starting a student group on campus.  The patient is doing well and interventions are proving effective.  He provided good  feedback especially to the other two young men in the group. They seem to respect his perspective. His sobriety date remains 8/3.  Family Program: Family present? No   Name of family member(s):   UDS collected: No Results:   AA/NA attended?: [place]  Sponsor?: Yes, but he needs to find a more local and available sponsor   Isahi Godwin, LCAS

## 2014-03-07 ENCOUNTER — Other Ambulatory Visit (HOSPITAL_COMMUNITY): Payer: BC Managed Care – PPO | Attending: Psychiatry | Admitting: Psychology

## 2014-03-07 ENCOUNTER — Encounter (HOSPITAL_COMMUNITY): Payer: Self-pay | Admitting: Psychology

## 2014-03-07 DIAGNOSIS — E1069 Type 1 diabetes mellitus with other specified complication: Secondary | ICD-10-CM

## 2014-03-07 DIAGNOSIS — F1120 Opioid dependence, uncomplicated: Secondary | ICD-10-CM | POA: Diagnosis not present

## 2014-03-07 NOTE — Progress Notes (Signed)
    Daily Group Progress Note  Program: CD-IOP   Group Time: 1-2:30 pm  Participation Level: Active  Behavioral Response: Sharing  Type of Therapy: Process Group  Topic: Process: the first part of group was spent in process. Members shared about their efforts in early recovery, including attending 2-step meetings and other recovery-related activities. One member admitted she had drunk yesterday. As she discussed this terribly painful day, she admitted it was most likely her 'bottom". She cried as she pointed out she couldn't remember much of the day's events, even before she started drinking. The other group members offered good feedback and were very non-judgmental and supportive of this woman who clearly in terrible emotional pain.   Group Time: 2:45- 4pm  Participation Level: Active  Behavioral Response: Sharing  Type of Therapy: Psycho-education Group  Topic: Pro's & Con's: the second half of group was spent in a psycho-ed that invited members to identify the pros and cons of active addiction versus sobriety. Members were quick to point out the pros and cons of using, but struggled to identify the cons of sobriety. Despite continued challenging, members seemed 'stumped". The session ended, but not before 2 group members were instructed to attend some 12-step meetings or face possible discharge.   Summary: The patient arrived almost 30 minutes late. He apologized and explained that his GPS had taken him all over the county and had added over 30 minutes to his trip from [place], [place]. The patient reported he was doing well and had attended 1 meeting since the last group session. He noted that his shoulder was really bothering him and he would have to begin doing some physical therapy exercises to address his discomfort. The patient reminded the group he had had surgery last December for a torn Labrum and this surgery is where he first received narcotic pain medication. The patient quickly  identified the benefits of sobriety, including "regaining the trust" of his parents and loved ones. Working his recovery has also made him more 'grateful'. A con of active addiction was the abandonment his spiritual beliefs. The patient seems to be 'saying all the right things', but he is also graduating from the program on Monday. He provided good feedback and responded well to this intervention. His sobriety date remains 8/3.    Family Program: Family present? No   Name of family member(s):   UDS collected: No Results:  AA/NA attended?: YesThursday  Sponsor?: Yes   Charlen Bakula, LCAS

## 2014-03-09 ENCOUNTER — Encounter (HOSPITAL_COMMUNITY): Payer: Self-pay | Admitting: Psychology

## 2014-03-09 NOTE — Progress Notes (Signed)
    Daily Group Progress Note  Program: CD-IOP   Group Time: 1-2:30 pm  Participation Level: Active  Behavioral Response: Sharing  Type of Therapy: Process Group  Topic: Group Process: the first part of group was spent in process. Members shared about any issues or concerns that have appeared since early recovery. One member shared the details of a great weekend, while another complained about his lack of sleep. A new group member was present and he introduced himself during the session. There was good feedback and support among group members. Drug tests were collected from all members present today.   Group Time: 2:45- 4pm  Participation Level: Active  Behavioral Response: Appropriate and Sharing  Type of Therapy: Psycho-education Group  Topic: "How to Set Boundaries", Part 2/Graduation: the second half of group was spent in a psycho-ed on "How to Set Boundaries". A handout was provided and it listed 10 ways to set or establish boundaries. Members read the handout and discussed the suggestions provided. The focus of the session was spent identifying what values or belief systems members hold and what they have determined is acceptable and unacceptable in relationship with others. Near the end of the session, a graduation ceremony was held for a member who was leaving the program, having completed successfully. There were kind words of hope and encouragement for the departing member and the session was emotional with the member expressing his appreciation for the time spent in group and friendships made while he was here.   Summary: The patient arrived 45 minutes late today. He stated that he had had some traffic problems and then couldn't find a parking place here at [facility]. He has always had excuses. The patient reported he had completed a chemistry test this morning and done well. He seemed very pleased with how well he had done. The patient reported he had attended 2 AA meetings over the  weekend and found the meetings better than in past experiences. The patient provided good feedback in the discussion on Boundaries and continues to provide hope and support to a fellow group members who is struggling with a family members. During his graduation, the patient was very attentive and polite as he received positive and affirming beliefs and feelings about him as held by his fellow group members. The patient leaves our program having met the requirements. It remains to be seen whether had actually intends to remain abstinent and continue his 12-steo meetings and the Fellowship. He leaves the program with a sobriety date of 8/3.   Family Program: Family present? No   Name of family member(s):   UDS collected: Yes Results: not back from lab  AA/NA attended?: YesSaturday and Sunday  Sponsor?: Yes   Nevaeha Finerty, LCAS

## 2014-03-10 ENCOUNTER — Encounter (HOSPITAL_COMMUNITY): Payer: Self-pay

## 2014-03-10 NOTE — Progress Notes (Unsigned)
    Daily Group Progress Note  Program: CD-IOP   Group Time: 1-2:30 pm  Participation Level: Active  Behavioral Response: Sharing  Type of Therapy: Psycho-education Group  Topic: Triggers: the first part of group was spent in a psycho-ed on "Triggers". Members identified some of their triggers and a discussion followed on addressing these triggers. There was good disclosure and feedback among group members.  Group Time: 2:45- 4pm  Participation Level: Active  Behavioral Response: Sharing  Type of Therapy: Psycho-education Group  Topic: Refusal Skills: the second half of group was spent discussing handouts and refusal skills. Members read from the handouts and discussed the options available when confronted or feeling pressured to use. Members challenged some of the examples, but the ensuing discussion was able to put them into perspective.   Summary: Patient was present for group and participated minimally.  Patient reported "people" as his biggest trigger and reported that he needed to change his peer group in an effort to remain sober. Patient was present for group and participated minimally.  Patient participated in reading and discussing the handouts "Saying No: Cases to Consider" and "Some Suggestions About Saying No." Patient reported that attending meetings was his largest motivator for staying sober.    Family Program: Family present? No   Name of family member(s):   UDS collected: No Results:   AA/NA attended?: YesSaturday  Sponsor?: Yes   [facility]

## 2014-08-17 ENCOUNTER — Encounter: Payer: Self-pay | Admitting: Sports Medicine

## 2014-08-17 ENCOUNTER — Ambulatory Visit (INDEPENDENT_AMBULATORY_CARE_PROVIDER_SITE_OTHER): Payer: Self-pay | Admitting: Sports Medicine

## 2014-08-17 VITALS — BP 117/76 | HR 79 | Ht 70.0 in | Wt 192.0 lb

## 2014-08-17 DIAGNOSIS — M24411 Recurrent dislocation, right shoulder: Secondary | ICD-10-CM

## 2014-08-17 DIAGNOSIS — F418 Other specified anxiety disorders: Secondary | ICD-10-CM

## 2014-08-17 DIAGNOSIS — F419 Anxiety disorder, unspecified: Principal | ICD-10-CM

## 2014-08-17 DIAGNOSIS — F329 Major depressive disorder, single episode, unspecified: Secondary | ICD-10-CM

## 2014-08-17 DIAGNOSIS — J3081 Allergic rhinitis due to animal (cat) (dog) hair and dander: Secondary | ICD-10-CM

## 2014-08-17 DIAGNOSIS — F32A Depression, unspecified: Secondary | ICD-10-CM

## 2014-08-17 MED ORDER — SERTRALINE HCL 50 MG PO TABS: 50.0000 mg | ORAL_TABLET | Freq: Every day | ORAL | Status: DC

## 2014-08-17 MED ORDER — TRIAMCINOLONE ACETONIDE 55 MCG/ACT NA AERO: 2.0000 | INHALATION_SPRAY | Freq: Every day | NASAL | Status: DC

## 2014-08-17 MED ORDER — LORATADINE 10 MG PO TABS: 10.0000 mg | ORAL_TABLET | Freq: Every day | ORAL | Status: DC

## 2014-08-17 NOTE — Assessment & Plan Note (Signed)
Continue Singulair, loratadine, switching to Nasacort.

## 2014-08-17 NOTE — Assessment & Plan Note (Signed)
Has been off of Lexapro, adding Zoloft 50, we will taper to 100 if needed. PHQ9 and GAD 7 score is a very high today. We will check T-scores at each visit, return in one month.

## 2014-08-17 NOTE — Progress Notes (Signed)
  Subjective:    CC: Follow-up  HPI: Anxiety and depression: Has been off of all medications including Lexapro, is endorsing severe difficulty sleeping, poor energy, and guilt, moderate anhedonia, depressed mood, change in appetite, and trouble concentrating, and mild thoughts that he would be better off dead, denies any suicidal or homicidal ideation and denies a plan, contracts for safety. He also endorses moderate anxiety, worry, and irritability and mild excessive worry, difficulty relaxing and restlessness. Amenable to restart an SSRI.  Right shoulder dislocation: Is post-arthroscopic labral repair, has had multiple dislocations since, tells me he has not been very compliant with his rehabilitation exercises.  Past medical history, Surgical history, Family history not pertinant except as noted below, Social history, Allergies, and medications have been entered into the medical record, reviewed, and no changes needed.   Review of Systems: No fevers, chills, night sweats, weight loss, chest pain, or shortness of breath.   Objective:    General: Well Developed, well nourished, and in no acute distress.  Neuro: Alert and oriented x3, extra-ocular muscles intact, sensation grossly intact.  HEENT: Normocephalic, atraumatic, pupils equal round reactive to light, neck supple, no masses, no lymphadenopathy, thyroid nonpalpable.  Skin: Warm and dry, no rashes. Cardiac: Regular rate and rhythm, no murmurs rubs or gallops, no lower extremity edema.  Respiratory: Clear to auscultation bilaterally. Not using accessory muscles, speaking in full sentences.  Impression and Recommendations:

## 2014-08-17 NOTE — Assessment & Plan Note (Signed)
We are going to put Dalton Jackson back in to formal physical therapy. We will try to find a place near where he goes to school at [employer]

## 2014-09-16 ENCOUNTER — Ambulatory Visit: Payer: Self-pay | Admitting: Sports Medicine

## 2014-09-16 ENCOUNTER — Encounter: Payer: Self-pay | Admitting: Sports Medicine

## 2014-09-19 ENCOUNTER — Other Ambulatory Visit: Payer: Self-pay | Admitting: Sports Medicine

## 2014-09-19 DIAGNOSIS — E109 Type 1 diabetes mellitus without complications: Secondary | ICD-10-CM

## 2014-09-19 MED ORDER — INSULIN GLARGINE 100 UNIT/ML SOLOSTAR PEN: 7.0000 [IU] | PEN_INJECTOR | Freq: Every day | SUBCUTANEOUS | Status: DC

## 2014-11-17 ENCOUNTER — Other Ambulatory Visit: Payer: Self-pay | Admitting: Sports Medicine

## 2014-11-17 MED ORDER — ONETOUCH DELICA LANCETS 33G MISC: Status: AC

## 2015-03-28 ENCOUNTER — Other Ambulatory Visit: Payer: Self-pay | Admitting: Sports Medicine

## 2015-03-28 DIAGNOSIS — J309 Allergic rhinitis, unspecified: Secondary | ICD-10-CM

## 2015-03-28 MED ORDER — MONTELUKAST SODIUM 10 MG PO TABS: 10.0000 mg | ORAL_TABLET | Freq: Every day | ORAL | Status: DC

## 2015-04-12 ENCOUNTER — Ambulatory Visit (INDEPENDENT_AMBULATORY_CARE_PROVIDER_SITE_OTHER): Payer: Managed Care, Other (non HMO) | Admitting: Family Medicine

## 2015-04-12 ENCOUNTER — Encounter: Payer: Self-pay | Admitting: Family Medicine

## 2015-04-12 VITALS — BP 123/70 | HR 74 | Wt 185.0 lb

## 2015-04-12 DIAGNOSIS — M24411 Recurrent dislocation, right shoulder: Secondary | ICD-10-CM | POA: Diagnosis not present

## 2015-04-12 NOTE — Assessment & Plan Note (Signed)
Recurrent. Recommend patient follow-up with orthopedic surgery for consultation regarding a repeat repair. Okay to return to work

## 2015-04-12 NOTE — Progress Notes (Signed)
Dalton Jackson is a 19 y.o. male who presents to [facility]  today for shoulder dislocation. Patient has a history of recurrent right-sided shoulder dislocation. He's had surgical repair in his distal shoulder about 16 times per his estimation since it was repaired appears ago. He had a dislocation on his own about 2 weeks ago. He wore sling for a few days at his work notice. He is here today for a letter to return to work. He feels fine with no pain. He is able to do his job duties without pain currently.   Past Medical History  Diagnosis Date  . 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 Diabetes mellitus without complication ([place])   . Diabetes mellitus, type II [facility])    Past Surgical History  Procedure Laterality Date  . Tonsillectomy and adenoidectomy     Social History  Substance Use Topics  . Smoking status: Never Smoker   . Smokeless tobacco: Not on file  . Alcohol Use: 7.2 oz/week    12 Cans of beer per week   family history includes Alcohol abuse in his paternal grandfather; Hyperlipidemia in his mother; Thyroid disease in his mother.  ROS as above Medications: Current Outpatient Prescriptions  Medication Sig Dispense Refill  . acetone, urine, test strip 1 strip by Does not apply route.    . Blood Glucose Monitoring Suppl (ONE TOUCH ULTRA SYSTEM KIT) W/DEVICE KIT 1 kit by Does not apply route once. Please include lancets, strips. 1 each 0  . budesonide (PULMICORT) 180 MCG/ACT inhaler Inhale into the lungs 2 (two) times daily.    Marland Kitchen EPINEPHrine (EPIPEN) 0.3 mg/0.3 mL IJ SOAJ injection Inject 0.3 mg into the muscle once. PRN FOR ALLERGIC REACTION    . glucose blood (ONE TOUCH ULTRA TEST) test strip 1 each.    . Insulin Glargine (LANTUS SOLOSTAR) 100 UNIT/ML Solostar Pen Inject 7 Units into the skin at bedtime. Please include QS 3 months. 1 pen 11  . loratadine (CLARITIN) 10 MG tablet Take 1 tablet (10 mg total) by mouth  daily.    . montelukast (SINGULAIR) 10 MG tablet Take 1 tablet (10 mg total) by mouth at bedtime. 90 tablet 3  . ONETOUCH DELICA LANCETS 46K MISC To check blood sugar daily. DX: E10.9 100 each 11  . triamcinolone (NASACORT AQ) 55 MCG/ACT AERO nasal inhaler Place 2 sprays into the nose daily. 1 Inhaler 12  . glucagon 1 MG injection Inject 1 mg into the skin.    Marland Kitchen insulin aspart (NOVOLOG) 100 UNIT/ML injection For use in insulin pump.  Total daily dose up to 30 units.     No current facility-administered medications for this visit.   Allergies  Allergen Reactions  . Other Anaphylaxis    Melons  . Pertussis Vaccine      Exam:  BP 123/70 mmHg  Pulse 74  Wt 185 lb (83.915 kg) Gen: Well NAD Right shoulder normal-appearing nontender full motion. Mildly positive anterior apprehension test. Strength is intact throughout. Pulses capillary refill sensation intact distally. Psych: Alert and oriented normal affect normal speech and thought processes.  No results found for this or any previous visit (from the past 24 hour(s)). No results found.   Please see individual assessment and plan sections.

## 2015-04-12 NOTE — Patient Instructions (Signed)
Thank you for coming in today. I do recommend you get evaluated for potential repeat surgery for shoulder dislocation. [facility] can [facility] or [facility] your leisure. Return as needed

## 2015-08-31 ENCOUNTER — Ambulatory Visit (INDEPENDENT_AMBULATORY_CARE_PROVIDER_SITE_OTHER): Payer: Managed Care, Other (non HMO) | Admitting: Osteopathic Medicine

## 2015-08-31 ENCOUNTER — Encounter: Payer: Self-pay | Admitting: Osteopathic Medicine

## 2015-08-31 VITALS — BP 114/71 | HR 70 | Temp 98.7°F | Wt 184.0 lb

## 2015-08-31 DIAGNOSIS — J069 Acute upper respiratory infection, unspecified: Secondary | ICD-10-CM | POA: Diagnosis not present

## 2015-08-31 DIAGNOSIS — R6889 Other general symptoms and signs: Secondary | ICD-10-CM | POA: Diagnosis not present

## 2015-08-31 DIAGNOSIS — R059 Cough, unspecified: Secondary | ICD-10-CM

## 2015-08-31 DIAGNOSIS — R05 Cough: Secondary | ICD-10-CM

## 2015-08-31 LAB — POCT INFLUENZA A/B
Influenza A, POC: NEGATIVE
Influenza B, POC: NEGATIVE

## 2015-08-31 MED ORDER — METHYLPREDNISOLONE 4 MG PO TBPK: ORAL_TABLET | ORAL | Status: DC

## 2015-08-31 MED ORDER — FLUTICASONE PROPIONATE HFA 110 MCG/ACT IN AERO: 2.0000 | INHALATION_SPRAY | Freq: Two times a day (BID) | RESPIRATORY_TRACT | Status: DC

## 2015-08-31 MED ORDER — BENZONATATE 200 MG PO CAPS: 200.0000 mg | ORAL_CAPSULE | Freq: Two times a day (BID) | ORAL | Status: DC | PRN

## 2015-08-31 NOTE — Patient Instructions (Addendum)
Dalton Jackson'S HOME CARE INSTRUCTIONS: VIRAL ILLNESSES - ACHES/PAINS, SORE THROAT, SINUSITIS, COUGH, GASTRITIS   FRIST, A FEW NOTES ON OVER-THE-COUNTER MEDICATIONS!   USE CAUTION - MANY OVER-THE-COUNTER MEDICATIONS COME IN COMBINATIONS OF MULTIPLE GENERIC MEDICINES. FOR INSTANCE, NYQUIL HAS TYLENOL + COUGH MEDICINE + AN ANTIHISTAMINE, SO BE CAREFUL YOU'RE NOT TAKING A COMBINATION MEDICINE WHICH CONTAINS MEDICATIONS YOU'RE ALSO TAKING SEPARATELY (LIKE NYQUIL SYRUP PLUS TYLENOL PILLS).   YOUR PHARMACIST CAN HELP YOU AVOID MEDICATION INTERACTIONS AND DUPLICATIONS - ASK FOR THEIR HELP IF YOU ARE CONFUSED OR UNSURE ABOUT WHAT TO PURCHASE OVER-THE-COUNTER!   REMEMBER - IF YOU'RE EVER CONCERNED ABOUT MEDICATION SIDE EFFECTS, OR IF YOU'RE EVER CONCERNED YOUR SYMPTOMS ARE GETTING WORSE DESPITE TREATMENT, PLEASE CALL THE DOCTOR'S OFFICE! IF AFTER-HOURS, YOU CAN BE SEEN IN URGENT CARE OR, FOR SEVERE ILLNESS, PLEASE GO TO THE EMERGENCY ROOM.   TREAT SINUS CONGESTION, RUNNY NOSE & POSTNASAL DRIP:  Treat to increase airflow through sinuses, decrease congestion pain and prevent bacterial growth!   Remember, only 0.5-2% of sinus infections are due to a bacteria, the rest are due to a virus (usually the common cold) which will not get better with antibiotics!  NASAL SPRAYS: generally safe and should not interact with other medicines. Can take any of these medications, either alone or together... FLONASE (FLUTICASONE) - 2 sprays in each nostril twice per day (also a great allergy medicine to use long-term!) AFRIN (OXYMETOLAZONE) - use sparingly because it will cause rebound congestion, NEVER USE IN KIDS  SALINE NASAL SPRAY- no limit, but avoid use after other nasal sprays or it can wash the medicine away PRESCRTIPTION ATROVENT - as directed on prescription bottle  ANTIHISTAMINES: Helps dry out runny nose and decreases postnasal drip. Benadryl may cause drowsiness but other preparations should not be  as sedating. Certain kinds are not as safe in elderly individuals. OK to use unless Dr A says otherwise.  Only use one of the following... BENADRYL (DIPHENHYDRAMINE) - 25-50 mg every 6 hours ZYRTEC (CETIRIZINE) - 5-10 mg daily CLARITIN (LORATIDINE) - less potent. 10 mg daily ALLEGRA (FEXOFENADINE) - least likely to cause drowsiness! 180 mg daily or 60 mg twice per day  DECONGESTANTS: Helps dry out runny nose and helps with sinus pain. May cause insomnia, or sometimes elevated heart rate. Can cause problems if used often in people with high blood pressure. OK to use unless Dr A says otherwise. NEVER USE IN KIDS UNDER 2 YEARS OLD. Only use one of the following... SUDAFED (PSEUDOEPHEDINE) - 60 mg every 4 - 6 hours, also comes in 120 mg extended release every 12 hours, maximum 240 mg in 24 hours.  SUDAED PE (PHENYLEPHRINE) - 10 mg every 4 - 6 hours, maximum 60 mg per day  COMBINATIONS OF ABOVE ANTIHISTAMINES + DECONGESTANTS: these usually require you to show your ID at the pharmacy counter. You can also purchase these medicines separately as noted above.  Only use one of the following... ZYRTEC-D (CETIRIZINE + PSEUDOEPHEDRINE) - 12 hour formulation as directed CLARITIN-D (LORATIDINE + PSEUDOEPHEDRINE) - 12 and 24 hour formulations as directed ALLEGRA-D (FEXOFENADINE + PSEUDOEPHEDRINE) - 12 and 24 hour formulations as directed  PRESCRIPTION TREATMENT FOR SINUS PROBLEMS: Can include nasal sprays, pills, or antibiotics in the case of true bacterial infection. Not everyone needs an antibiotic but there are other medicines which will help you feel better while your body fights the infection!    TREAT COUGH & SORE THROAT:  Remember, cough is the body's way of protecting your   airways and lungs, it's a hard-wired reflex that is tough for medicines to treat!   Irritation to the airways will cause cough. This irritation is usually caused by upper airway problems like postnasal drip (treat as above)  and viral sore throat, but in severe cases can be due to lower airway problems like bronchitis or pneumonia, which a doctor can usually hear on exam of your lungs or see on an X-ray.  Sore throat is almost always due to a virus, but occasionally caused by certain strains of Strep, which requires antibiotics.   Exercise and smoking may make cough worse - take it easy while you're sick, and QUIT SMOKING!   Cough due to viral infection can linger for 2-3 weeks or so. If you're coughing longer than you think you should, or if the cough is severe, please make an appointment in the office - you may need a chest X-ray.  EXPECTORANT: Used to help clear airways, take these with PLENTY of water to help thin mucus secretions and make the mucus easier to cough up  Only use one of the following... ROBITUSSIN (DEXTROMETHORPHAN OR GUAIFENISEN depending on formulation)  MUCINEX (GUAIFENICEN) - usually longer acting  COUGH DROPS/LOZENGES: Whichever over-the-counter agent you prefer!  Here are some suggestions for ingredients to look for (can take both)... BENZOCAINE - numbing effect, also in CHLORASEPTIC THROAT SPRAY MENTHOL - cooling effect, also in Vick's ointment  HONEY: has gone head-to-head in several clinical trials with prescription cough medicines and found to be equally effective! Try 1 Teaspoon Honey + 2 Drops Lemon Juice, as much as you want to use. NONE FOR KIDS UNDER AGE 30  HERBAL TEA: There are certain ingredients which help "coat the throat" to relieve pain  such as ELM BARK, LICORICE ROOT, MARSHMALLOW ROOT  PRESCRIPTION TREATMENT FOR COUGH: Reserved for severe cases. Can include pills, syrups or inhalers.    TREAT ACHES & PAINS, FEVER:  Illness causes aches, pains and fever as your body increases its natural inflammation response to help fight the infection.   Rest, good hydration and nutrition, and taking anti-inflammatory medications will help.   Remember: a true fever is a  temperature 100.4 or higher. If you have a fever that is 105.0 or higher, that is a dangerous level and needs medical attention in the office or in the ER!  Can take both of these together if it's ok with your doctor... IBUPROFEN - 400-800 mg every 6 - 8 hours. Ibuprofen and similar medications can cause problems for people with heart disease or history of stomach ulcers, check with Dr A first if you're concerned. Lower doses are usually safe and effective.  TYLENOL (ACETAMINOPHEN) - [phone] mg every 6 hours. It won't cause problems with heart or stomach.     TREAT GASTROINTESTINAL SYMPTOMS:  Hydrate, hydrate, hydrate! Try drinking Gatorade/Powerade, broth/soup. Avoid milk and juice, these can make diarrhea worse. You can drink water, of course, but if you are also having vomiting and loose stool you are losing electrolytes which water alone can't replace.  IMMODIUM (LOPERAMIDE) - as directed on the bottle to help with loose stool PRESCRIPTION ZOFRAN (ONDANSETRON) OR PHENERGAN (PROMETHAZINE) - as directed to help nausea and vomiting. Try taking these before you eat if you are having trouble keeping food down.     REMEMBER - THE MOST IMPORTANT THINGS YOU CAN DO TO AVOID CATCHING OR SPREADING ILLNESS INCLUDE:   COVER YOUR COUGH WITH YOUR ARM, NOT WITH YOUR HANDS!   DISINFECT COMMONLY  USED SURFACES (SUCH AS TELEPHONES & DOORKNOBS) WHEN YOU OR SOMEONE CLOSE TO YOU IS FEELING SICK!   BE SURE VACCINES ARE UP TO DATE - GET A FLU SHOT EVERY YEAR! THE FLU CAN BE DEADLY FOR BABIES, ELDERLY FOLKS, AND PEOPLE WITH WEAK IMMUNE SYSTEMS - YOU SHOULD BE VACCINATED TO HELP PREVENT YOURSELF FROM GETTING SICK, BUT ALSO TO PREVENT SOMEONE ELSE FROM GETTING AN INFECTION WHICH MAY HOSPITALIZE OR KILL THEM.  GOOD NUTRITION AND HEALTHY LIFESTYLE WILL HELP YOUR IMMUNE SYSTEM YEAR-ROUND! THERE IS NO MAGIC SUPPLEMENT!  AND ABOVE ALL - WASH YOUR HANDS OFTEN!

## 2015-08-31 NOTE — Progress Notes (Signed)
HPI: Dalton Jackson is a 20 y.o. male who [facility] [place]  today for chief complaint of:  Chief Complaint  Patient presents with  . Cough   . Quality: coughing, fatigue . Assoc signs/symptoms: see ROS . Duration: 7 days . Modifying factors: has tried the following OTC/Rx medications: OTC cough meds, Benzonatate, Ibuprofen Tylenol  . Context: (+) hx exercise induced asthma, respiratory illness/asthma   Past medical, social and family history reviewed. Current medications and allergies reviewed.     Review of Systems: CONSTITUTIONAL: yes fever/chills, about 101.5 at home  HEAD/EYES/EARS/NOSE/THROAT: yes headache, no vision change or hearing change, nosore throat CARDIAC: No chest pain/pressure/palpitations RESPIRATORY: yes cough, no shortness of breath GASTROINTESTINAL: no nausea, no vomiting, no abdominal pain, no diarrhea MUSCULOSKELETAL: yes myalgia/arthralgia   Exam:  BP 114/71 mmHg  Pulse 70  Temp(Src) 98.7 F (37.1 C) (Oral)  Wt 184 lb (83.462 kg) Constitutional: VSS, see above. General Appearance: alert, well-developed, well-nourished, NAD Eyes: Normal lids and conjunctive, non-icteric sclera,  Ears, Nose, Mouth, Throat: Normal external inspection ears/nares/mouth/lips/gums, normal TM, MMM;       posterior pharynx with erythema, without exudate Neck: No masses, trachea midline. normal lymph nodes Respiratory: Normal respiratory effort. No  wheeze/rhonchi/rales Cardiovascular: S1/S2 normal, no murmur/rub/gallop auscultated. RRR.   Results for orders placed or performed in visit on 08/31/15 (from the past 72 hour(s))  POCT Influenza A/B     Status: None   Collection Time: 08/31/15  1:49 PM  Result Value Ref Range   Influenza A, POC Negative Negative   Influenza B, POC Negative Negative     ASSESSMENT/PLAN: See ptineted pt instructions as well, Rx as below, hx opiate dependence so no stronger cough meds. RTC/ER  precautions reviewed.   Flu-like symptoms - Plan: POCT Influenza A/B  Viral upper respiratory illness - Plan: methylPREDNISolone (MEDROL DOSEPAK) 4 MG TBPK tablet  Cough - Plan: fluticasone (FLOVENT HFA) 110 MCG/ACT inhaler, benzonatate (TESSALON) 200 MG capsule    Return if symptoms worsen or fail to improve.

## 2017-11-11 ENCOUNTER — Encounter: Payer: Self-pay | Admitting: Internal Medicine

## 2017-11-21 DIAGNOSIS — F1120 Opioid dependence, uncomplicated: Secondary | ICD-10-CM | POA: Insufficient documentation

## 2018-02-06 ENCOUNTER — Encounter: Payer: Self-pay | Admitting: Endocrinology

## 2018-02-06 ENCOUNTER — Ambulatory Visit (INDEPENDENT_AMBULATORY_CARE_PROVIDER_SITE_OTHER): Payer: Managed Care, Other (non HMO) | Admitting: Endocrinology

## 2018-02-06 VITALS — BP 136/78 | HR 80 | Temp 97.7°F | Ht 69.0 in | Wt 171.4 lb

## 2018-02-06 DIAGNOSIS — E10649 Type 1 diabetes mellitus with hypoglycemia without coma: Secondary | ICD-10-CM | POA: Diagnosis not present

## 2018-02-06 DIAGNOSIS — F329 Major depressive disorder, single episode, unspecified: Secondary | ICD-10-CM | POA: Diagnosis not present

## 2018-02-06 DIAGNOSIS — F1120 Opioid dependence, uncomplicated: Secondary | ICD-10-CM

## 2018-02-06 DIAGNOSIS — F419 Anxiety disorder, unspecified: Secondary | ICD-10-CM

## 2018-02-06 DIAGNOSIS — E109 Type 1 diabetes mellitus without complications: Secondary | ICD-10-CM

## 2018-02-06 LAB — POCT GLYCOSYLATED HEMOGLOBIN (HGB A1C): Hemoglobin A1C: 7.1 % — AB (ref 4.0–5.6)

## 2018-02-06 MED ORDER — INSULIN LISPRO 100 UNIT/ML ~~LOC~~ SOLN: SUBCUTANEOUS | 11 refills | Status: DC

## 2018-02-06 MED ORDER — GLUCOSE BLOOD VI STRP
1.0000 | ORAL_STRIP | Freq: Four times a day (QID) | 12 refills | Status: AC
Start: 2018-02-06 — End: ?

## 2018-02-06 MED ORDER — INSULIN GLARGINE 100 UNIT/ML SOLOSTAR PEN: 40.0000 [IU] | PEN_INJECTOR | SUBCUTANEOUS | 11 refills | Status: DC

## 2018-02-06 NOTE — Progress Notes (Addendum)
Subjective:    Patient ID: Dalton Jackson, male    DOB: 26-Dec-1995, 22 y.o.   MRN: [id number]  HPI pt is referred by Dr Benjamin Stain, for diabetes.  Pt states DM was dx'ed in 2014, when he presented with polyuria; he has mild if any neuropathy of the lower extremities; he is unaware of any associated chronic complications; he has been on insulin since soon after dx; pt says his diet and exercise are good; he has never had pancreatitis, pancreatic surgery, severe hypoglycemia or DKA.  He works as a Museum/gallery exhibitions officer, 2nd shift.  Glycemic control is chronically poor.  He took pump rx until 2017.  He now takes lantus 30/d, and prn novolog (averages a total of approx 20-25 units per day).  He says depression and drug use has compromised his ability to control glucose.  He requests a cbg meter.  He has mild hypoglycemia approx twice per month.  This usually happens when he takes too much novolog during the day.   Past Medical History:  Diagnosis Date  . Arnold-Chiari malformation, type II (HCC)    outgrew  . Diabetes mellitus without complication (HCC)   . Diabetes mellitus, type II [facility])     Past Surgical History:  Procedure Laterality Date  . TONSILLECTOMY AND ADENOIDECTOMY      Social History   Socioeconomic History  . Marital status: Single    Spouse name: Not on file  . Number of children: Not on file  . Years of education: Not on file  . Highest education level: Not on file  Occupational History  . Not on file  Social Needs  . Financial resource strain: Not on file  . Food insecurity:    Worry: Not on file    Inability: Not on file  . Transportation needs:    Medical: Not on file    Non-medical: Not on file  Tobacco Use  . Smoking status: Never Smoker  . Smokeless tobacco: Never Used  Substance and Sexual Activity  . Alcohol use: Yes    Alcohol/week: 12.0 standard drinks    Types: 12 Cans of beer per week  . Drug use: Not Currently    Frequency: 300.0 times per week    Types: Hydrocodone, Hydromorphone, Oxycodone  . Sexual activity: Not on file  Lifestyle  . Physical activity:    Days per week: Not on file    Minutes per session: Not on file  . Stress: Not on file  Relationships  . Social connections:    Talks on phone: Not on file    Gets together: Not on file    Attends religious service: Not on file    Active member of club or organization: Not on file    Attends meetings of clubs or organizations: Not on file    Relationship status: Not on file  . Intimate partner violence:    Fear of current or ex partner: Not on file    Emotionally abused: Not on file    Physically abused: Not on file    Forced sexual activity: Not on file  Other Topics Concern  . Not on file  Social History Narrative  . Not on file    Current Outpatient Medications on File Prior to Visit  Medication Sig Dispense Refill  . acetone, urine, test strip 1 strip by Does not apply route.    . buprenorphine-naloxone (SUBOXONE) 8-2 mg SUBL SL tablet Place 1 tablet under the tongue 2 (two) times daily.    Marland Kitchen  EPINEPHrine (EPIPEN) 0.3 mg/0.3 mL IJ SOAJ injection Inject 0.3 mg into the muscle once. PRN FOR ALLERGIC REACTION    . escitalopram (LEXAPRO) 20 MG tablet Take 20 mg by mouth daily.    . Eszopiclone 3 MG TABS Take 1 tablet by mouth at bedtime.    . loratadineMarland Kitchen (CLARITIN) 10 MG tablet Take 1 tablet (10 mg total) by mouth daily.    . montelukast (SINGULAIR) 10 MG tablet Take 1 tablet (10 mg total) by mouth at bedtime. 90 tablet 3  . ONETOUCH DELICA LANCETS 33G MISC To check blood sugar daily. DX: E10.9 100 each 11  . glucagon 1 MG injection Inject 1 mg into the skin.     No current facility-administered medications on file prior to visit.     Allergies  Allergen Reactions  . Other Anaphylaxis    Melons  . Pertussis Vaccine     Family History  Problem Relation Age of Onset  . Thyroid disease Mother   . Hyperlipidemia Mother   . Alcohol abuse Paternal Grandfather       BP 136/78 (BP Location: Left Arm, Patient Position: Sitting, Cuff Size: Normal)   Pulse 80   Temp 97.7 F (36.5 C) (Oral)   Ht 5\' 9"  (1.753 m)   Wt 171 lb 6.4 oz (77.7 kg)   SpO2 98%   BMI 25.31 kg/m    Review of Systems denies blurry vision, headache, chest pain, sob, n/v, muscle cramps, excessive diaphoresis, and cold intolerance.  He reports ongoing excessive thirst, difficulty with concentration, rhinorrhea, and fatigue.  Depression is somewhat well-controlled.  He has lost 15 lbs x 4 years.       Objective:   Physical Exam VS: see vs page GEN: no distress HEAD: head: no deformity eyes: no periorbital swelling, no proptosis external nose and ears are normal mouth: no lesion seen NECK: supple, thyroid is not enlarged CHEST WALL: no deformity LUNGS: clear to auscultation CV: reg rate and rhythm, no murmur ABD: abdomen is soft, nontender.  no hepatosplenomegaly.  not distended.  no hernia. SKIN:  Insulin injection sites at the anterior abdomen has mild hypertrophy of sq fat.   MUSCULOSKELETAL: muscle bulk and strength are grossly normal.  no obvious joint swelling.  gait is normal and steady EXTEMITIES: no deformity.  no ulcer on the feet.  feet are of normal color and temp.  no edema PULSES: dorsalis pedis intact bilat.  no carotid bruit NEURO:  cn 2-12 grossly intact.   readily moves all 4's.  sensation is intact to touch on the feet SKIN:  Normal texture and temperature.  No rash or suspicious lesion is visible.   NODES:  None palpable at the neck PSYCH: alert, well-oriented.  Does not appear anxious nor depressed.  A1c=10.6%    Assessment & Plan:  Type 1 DM: severe exacerbation Opioid dependence: in this context, he needs the simplest possible regimen. Anxiety/depression: continuing rx of this will help rx of DM Hypoglycemia: this limits aggressiveness of glycemic control.  Patient Instructions  good diet and exercise significantly improve the control of your  diabetes.  please let me know if you wish to be referred to a dietician.  high blood sugar is very risky to your health.  you should see an eye doctor and dentist every year.  It is very important to get all recommended vaccinations.  Controlling your blood pressure and cholesterol drastically reduces the damage diabetes does to your body.  Those who smoke should quit.  Please discuss these with your doctor.  check your blood sugar twice a day.  vary the time of day when you check, between before the 3 meals, and at bedtime.  also check if you have symptoms of your blood sugar being too high or too low.  please keep a record of the readings and bring it to your next appointment here (or you can bring the meter itself).  You can write it on any piece of paper.  please call us sooner if your blood sugar goes below 70, or if you have a lot of readings over 200. Here are 2 new meters.  I have sent a prescription to your pharmacy, for strips.  To help your diabetes, please continue to work with Dr Salvadore Farber on the depression, as doing so also helps your diabetes.  For now, please:  Increase the lantus to 40 units each morning, and: Please continue the same as-needed novolog (although I think and hope you need less) On this type of insulin schedule, you should eat meals on a regular schedule.  If a meal is missed or significantly delayed, your blood sugar could go low. Please come back for a follow-up appointment in 2 months.

## 2018-02-06 NOTE — Patient Instructions (Addendum)
good diet and exercise significantly improve the control of your diabetes.  please let me know if you wish to be referred to a dietician.  high blood sugar is very risky to your health.  you should see an eye doctor and dentist every year.  It is very important to get all recommended vaccinations.  Controlling your blood pressure and cholesterol drastically reduces the damage diabetes does to your body.  Those who smoke should quit.  Please discuss these with your doctor.  check your blood sugar twice a day.  vary the time of day when you check, between before the 3 meals, and at bedtime.  also check if you have symptoms of your blood sugar being too high or too low.  please keep a record of the readings and bring it to your next appointment here (or you can bring the meter itself).  You can write it on any piece of paper.  please call us sooner if your blood sugar goes below 70, or if you have a lot of readings over 200. Here are 2 new meters.  I have sent a prescription to your pharmacy, for strips.  To help your diabetes, please continue to work with Dr Salvadore Farberorrington on the depression, as doing so also helps your diabetes.  For now, please:  Increase the lantus to 40 units each morning, and: Please continue the same as-needed novolog (although I think and hope you need less) On this type of insulin schedule, you should eat meals on a regular schedule.  If a meal is missed or significantly delayed, your blood sugar could go low. Please come back for a follow-up appointment in 2 months.

## 2018-03-19 ENCOUNTER — Ambulatory Visit (HOSPITAL_COMMUNITY): Payer: 59 | Admitting: Psychiatry

## 2018-03-19 ENCOUNTER — Encounter (HOSPITAL_COMMUNITY): Payer: Self-pay | Admitting: Psychiatry

## 2018-03-19 VITALS — BP 128/78 | HR 87 | Resp 14 | Ht 67.0 in | Wt 175.0 lb

## 2018-03-19 DIAGNOSIS — F411 Generalized anxiety disorder: Secondary | ICD-10-CM

## 2018-03-19 DIAGNOSIS — F331 Major depressive disorder, recurrent, moderate: Secondary | ICD-10-CM

## 2018-03-19 DIAGNOSIS — F1120 Opioid dependence, uncomplicated: Secondary | ICD-10-CM

## 2018-03-19 DIAGNOSIS — F1320 Sedative, hypnotic or anxiolytic dependence, uncomplicated: Secondary | ICD-10-CM

## 2018-03-19 MED ORDER — ESCITALOPRAM OXALATE 20 MG PO TABS: 30.0000 mg | ORAL_TABLET | Freq: Every day | ORAL | 0 refills | Status: DC

## 2018-03-19 MED ORDER — HYDROXYZINE HCL 25 MG PO TABS: 25.0000 mg | ORAL_TABLET | Freq: Every evening | ORAL | 0 refills | Status: DC

## 2018-03-19 NOTE — Patient Instructions (Signed)
Refer to therapy 
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
## 2018-04-07 ENCOUNTER — Ambulatory Visit (INDEPENDENT_AMBULATORY_CARE_PROVIDER_SITE_OTHER): Payer: 59 | Admitting: Licensed Clinical Social Worker

## 2018-04-07 DIAGNOSIS — F411 Generalized anxiety disorder: Secondary | ICD-10-CM | POA: Diagnosis not present

## 2018-04-07 DIAGNOSIS — F1120 Opioid dependence, uncomplicated: Secondary | ICD-10-CM

## 2018-04-07 DIAGNOSIS — F331 Major depressive disorder, recurrent, moderate: Secondary | ICD-10-CM | POA: Diagnosis not present
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
## 2018-04-13 ENCOUNTER — Other Ambulatory Visit (HOSPITAL_COMMUNITY): Payer: Self-pay | Admitting: Psychiatry

## 2018-04-14 ENCOUNTER — Ambulatory Visit: Payer: Managed Care, Other (non HMO) | Admitting: Endocrinology

## 2018-04-15 ENCOUNTER — Ambulatory Visit (INDEPENDENT_AMBULATORY_CARE_PROVIDER_SITE_OTHER): Payer: 59 | Admitting: Endocrinology

## 2018-04-15 ENCOUNTER — Encounter: Payer: Self-pay | Admitting: Endocrinology

## 2018-04-15 VITALS — BP 100/68 | HR 112 | Ht 67.0 in | Wt 180.2 lb

## 2018-04-15 DIAGNOSIS — E109 Type 1 diabetes mellitus without complications: Secondary | ICD-10-CM | POA: Diagnosis not present

## 2018-04-15 LAB — POCT GLYCOSYLATED HEMOGLOBIN (HGB A1C): HEMOGLOBIN A1C: 5.9 % — AB (ref 4.0–5.6)

## 2018-04-15 MED ORDER — INSULIN LISPRO 100 UNIT/ML ~~LOC~~ SOLN: SUBCUTANEOUS | 11 refills | Status: DC

## 2018-04-15 NOTE — Patient Instructions (Addendum)
check your blood sugar twice a day.  vary the time of day when you check, between before the 3 meals, and at bedtime.  also check if you have symptoms of your blood sugar being too high or too low.  please keep a record of the readings and bring it to your next appointment here (or you can bring the meter itself).  You can write it on any piece of paper.  please call us sooner if your blood sugar goes below 70, or if you have a lot of readings over 200.   Please continue the same lantus, and: Reduce the novolog to a total of approx 10 units per day.   On this type of insulin schedule, you should eat meals on a regular schedule.  If a meal is missed or significantly delayed, your blood sugar could go low. Please come back for a follow-up appointment in 3 months.

## 2018-04-15 NOTE — Progress Notes (Signed)
Subjective:    Patient ID: Dalton Jackson, male    DOB: Jun 15, 1995, 22 y.o.   MRN: [id number]  HPI Pt returns for f/u of diabetes mellitus: DM type: 1 Dx'ed: 2014 Complications: none Therapy: insulin since soon after dx DKA: never Severe hypoglycemia: never Pancreatitis: never Pancreatic imaging: normal on 2017 CT Other: He works as a Museum/gallery exhibitions officer, 2nd shift. Glycemic control is chronically poor; he took pump rx 2014-2017; depression and drug use has compromised self-care Interval history: pt states he feels well in general.  no cbg record, but states cbg's vary from 50-225.  He has mild hypoglycemia twice per day.  It is lowest when he misses a meal.  He averages a total of approx 15 units of novolog per day.   Past Medical History:  Diagnosis Date  . Arnold-Chiari malformation, type II (HCC)    outgrew  . Diabetes mellitus without complication (HCC)   . Diabetes mellitus, type II [facility])     Past Surgical History:  Procedure Laterality Date  . TONSILLECTOMY AND ADENOIDECTOMY      Social History   Socioeconomic History  . Marital status: Single    Spouse name: Not on file  . Number of children: Not on file  . Years of education: Not on file  . Highest education level: Not on file  Occupational History  . Not on file  Social Needs  . Financial resource strain: Not on file  . Food insecurity:    Worry: Not on file    Inability: Not on file  . Transportation needs:    Medical: Not on file    Non-medical: Not on file  Tobacco Use  . Smoking status: Never Smoker  . Smokeless tobacco: Never Used  Substance and Sexual Activity  . Alcohol use: Yes    Alcohol/week: 12.0 standard drinks    Types: 12 Cans of beer per week  . Drug use: Not Currently    Frequency: 300.0 times per week    Types: Hydrocodone, Hydromorphone, Oxycodone  . Sexual activity: Not on file  Lifestyle  . Physical activity:    Days per week: Not on file    Minutes per session: Not on file    . Stress: Not on file  Relationships  . Social connections:    Talks on phone: Not on file    Gets together: Not on file    Attends religious service: Not on file    Active member of club or organization: Not on file    Attends meetings of clubs or organizations: Not on file    Relationship status: Not on file  . Intimate partner violence:    Fear of current or ex partner: Not on file    Emotionally abused: Not on file    Physically abused: Not on file    Forced sexual activity: Not on file  Other Topics Concern  . Not on file  Social History Narrative  . Not on file    Current Outpatient Medications on File Prior to Visit  Medication Sig Dispense Refill  . acetone, urine, test strip 1 strip by Does not apply route.    . buprenorphine-naloxone (SUBOXONE) 8-2 mg SUBL SL tablet Place 1 tablet under the tongue 2 (two) times daily.    Marland Kitchen EPINEPHrine (EPIPEN) 0.3 mg/0.3 mL IJ SOAJ injection Inject 0.3 mg into the muscle once. PRN FOR ALLERGIC REACTION    . Eszopiclone 3 MG TABS Take 1 tablet by mouth at bedtime.    Marland Kitchen  Eszopiclone 3 MG TABS Take by mouth.    Marland Kitchen glucose blood (ONETOUCH VERIO) test strip 1 each by Other route 4 (four) times daily. And lancets 1/day 100 each 12  . hydrOXYzine (ATARAX/VISTARIL) 25 MG tablet Take 1 tablet (25 mg total) by mouth every evening. Take if needed. Do not drive 30 tablet 0  . Insulin Glargine (LANTUS SOLOSTAR) 100 UNIT/ML Solostar Pen Inject 40 Units into the skin every morning. Please include QS 3 months. 5 pen 11  . loratadine (CLARITIN) 10 MG tablet Take 1 tablet (10 mg total) by mouth daily.    . montelukast (SINGULAIR) 10 MG tablet Take 1 tablet (10 mg total) by mouth at bedtime. 90 tablet 3  . ONETOUCH DELICA LANCETS 33G MISC To check blood sugar daily. DX: E10.9 100 each 11  . escitalopram (LEXAPRO) 20 MG tablet TAKE 1 AND 1/2 TABLETS BY MOUTH DAILY 45 tablet 0  . glucagon 1 MG injection Inject 1 mg into the skin.     No current  facility-administered medications on file prior to visit.     Allergies  Allergen Reactions  . Other Anaphylaxis    Melons  . Pertussis Vaccine     Family History  Problem Relation Age of Onset  . Thyroid disease Mother   . Hyperlipidemia Mother   . Alcohol abuse Paternal Grandfather     BP 100/68 (BP Location: Right Arm, Patient Position: Sitting, Cuff Size: Normal)   Pulse (!) 112   Ht 5\' 7"  (1.702 m)   Wt 180 lb 3.2 oz (81.7 kg)   SpO2 97%   BMI 28.22 kg/m   Review of Systems He denies LOC    Objective:   Physical Exam VITAL SIGNS:  See vs page GENERAL: no distress Pulses: foot pulses are intact bilaterally.   MSK: no deformity of the feet or ankles.  CV: no edema of the legs or ankles Skin:  no ulcer on the feet or ankles.  normal color and temp on the feet and ankles Neuro: sensation is intact to touch on the feet and ankles.    Lab Results  Component Value Date   HGBA1C 5.9 (A) 04/15/2018   Lab Results  Component Value Date   CREATININE 0.82 03/17/2013   BUN 20 03/17/2013   NA 134 (L) 03/17/2013   K 4.3 03/17/2013   CL 97 03/17/2013   CO2 25 03/17/2013      Assessment & Plan:  Type 1 DM: overcontrolled  Patient Instructions  check your blood sugar twice a day.  vary the time of day when you check, between before the 3 meals, and at bedtime.  also check if you have symptoms of your blood sugar being too high or too low.  please keep a record of the readings and bring it to your next appointment here (or you can bring the meter itself).  You can write it on any piece of paper.  please call us sooner if your blood sugar goes below 70, or if you have a lot of readings over 200.   Please continue the same lantus, and: Reduce the novolog to a total of approx 10 units per day.   On this type of insulin schedule, you should eat meals on a regular schedule.  If a meal is missed or significantly delayed, your blood sugar could go low. Please come back for a  follow-up appointment in 3 months.

## 2018-04-16 ENCOUNTER — Ambulatory Visit (HOSPITAL_COMMUNITY): Payer: 59 | Admitting: Licensed Clinical Social Worker

## 2018-04-18 ENCOUNTER — Other Ambulatory Visit (HOSPITAL_COMMUNITY): Payer: Self-pay | Admitting: Psychiatry

## 2018-04-23 ENCOUNTER — Ambulatory Visit (HOSPITAL_COMMUNITY): Payer: 59 | Admitting: Psychiatry

## 2018-04-30 ENCOUNTER — Other Ambulatory Visit: Payer: Self-pay

## 2018-04-30 ENCOUNTER — Ambulatory Visit (INDEPENDENT_AMBULATORY_CARE_PROVIDER_SITE_OTHER): Payer: 59 | Admitting: Psychiatry

## 2018-04-30 ENCOUNTER — Encounter (HOSPITAL_COMMUNITY): Payer: Self-pay | Admitting: Psychiatry

## 2018-04-30 VITALS — BP 140/68 | HR 111 | Ht 67.0 in | Wt 181.0 lb

## 2018-04-30 DIAGNOSIS — F331 Major depressive disorder, recurrent, moderate: Secondary | ICD-10-CM

## 2018-04-30 DIAGNOSIS — F411 Generalized anxiety disorder: Secondary | ICD-10-CM | POA: Diagnosis not present

## 2018-04-30 DIAGNOSIS — F1120 Opioid dependence, uncomplicated: Secondary | ICD-10-CM | POA: Diagnosis not present

## 2018-04-30 MED ORDER — ESCITALOPRAM OXALATE 20 MG PO TABS: 30.0000 mg | ORAL_TABLET | Freq: Every day | ORAL | 1 refills | Status: DC

## 2018-04-30 MED ORDER — HYDROXYZINE HCL 25 MG PO TABS: ORAL_TABLET | ORAL | 0 refills | Status: DC
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
## 2018-05-15 ENCOUNTER — Ambulatory Visit (INDEPENDENT_AMBULATORY_CARE_PROVIDER_SITE_OTHER): Payer: 59 | Admitting: Licensed Clinical Social Worker

## 2018-05-15 DIAGNOSIS — F1120 Opioid dependence, uncomplicated: Secondary | ICD-10-CM

## 2018-05-15 DIAGNOSIS — F411 Generalized anxiety disorder: Secondary | ICD-10-CM

## 2018-05-15 DIAGNOSIS — F331 Major depressive disorder, recurrent, moderate: Secondary | ICD-10-CM | POA: Diagnosis not present

## 2018-05-15 NOTE — Progress Notes (Signed)
   THERAPIST PROGRESS NOTE  Session Time: 10:35am-11:24am   Participation Level: Active  Behavioral Response: CasualAlertEuthymic  Type of Therapy: Individual Therapy  Treatment Goals addressed: Changing unhelpful thinking patterns  Interventions: Treatment planning    Suicidal/Homicidal: Denied both  Therapist Interventions: Collaborated with patient to develop his treatment plans.  Identified treatment goals. Briefly described interventions he can expect as he participates in therapy.   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     Summary: Talked about how he would like to work on changing his thinking to be more positive and develop a better outlook on his future.  Also noted a compulsive behavior of shooting up water which he would like to stop.  Reported he does this a few times a month.  Indicated he wants help developing healthier intimate relationships as well. Already familiar with the concept of mindfulness.  Reported he has been meditating on and off for a couple years.  Has made it a regular practice at bedtime for the past few months.  Described how he aims to get into a trance-like state.  Finds it to be very relaxing.    Reported doing pretty well lately.  He is working full time as a Lobbyistfork lift driver.  Likes the job.  Reported he started talking to a girl he used to go to high school with.  She is going to college.      Plan: Focus for next session will be on mindfulness. Treatment plan review is due 08/14/18     Diagnosis:  MDD, recurrent, moderate                      GAD                         Opioid dependence on agonist therapy   Darrin LuisSolomon, Sarah A, LCSW 05/15/2018

## 2018-05-21 ENCOUNTER — Ambulatory Visit (HOSPITAL_COMMUNITY): Payer: 59 | Admitting: Licensed Clinical Social Worker

## 2018-05-28 ENCOUNTER — Encounter (HOSPITAL_COMMUNITY): Payer: Self-pay | Admitting: Psychiatry

## 2018-05-28 ENCOUNTER — Ambulatory Visit (INDEPENDENT_AMBULATORY_CARE_PROVIDER_SITE_OTHER): Payer: 59 | Admitting: Psychiatry

## 2018-05-28 ENCOUNTER — Other Ambulatory Visit: Payer: Self-pay

## 2018-05-28 VITALS — BP 120/82 | HR 98 | Ht 67.0 in | Wt 177.0 lb

## 2018-05-28 DIAGNOSIS — F1120 Opioid dependence, uncomplicated: Secondary | ICD-10-CM

## 2018-05-28 DIAGNOSIS — F411 Generalized anxiety disorder: Secondary | ICD-10-CM

## 2018-05-28 DIAGNOSIS — F331 Major depressive disorder, recurrent, moderate: Secondary | ICD-10-CM | POA: Diagnosis not present

## 2018-05-28 MED ORDER — ESCITALOPRAM OXALATE 20 MG PO TABS: 30.0000 mg | ORAL_TABLET | Freq: Every day | ORAL | 1 refills | Status: DC

## 2018-05-28 MED ORDER — ESZOPICLONE 3 MG PO TABS: 3.0000 mg | ORAL_TABLET | Freq: Every day | ORAL | 1 refills | Status: DC

## 2018-05-28 MED ORDER — HYDROXYZINE HCL 25 MG PO TABS: ORAL_TABLET | ORAL | 0 refills | Status: DC
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
## 2018-07-22 ENCOUNTER — Encounter (HOSPITAL_COMMUNITY): Payer: Self-pay | Admitting: Psychiatry

## 2018-07-22 ENCOUNTER — Ambulatory Visit (INDEPENDENT_AMBULATORY_CARE_PROVIDER_SITE_OTHER): Payer: 59 | Admitting: Psychiatry

## 2018-07-22 ENCOUNTER — Other Ambulatory Visit: Payer: Self-pay

## 2018-07-22 VITALS — BP 124/72 | HR 107 | Ht 67.0 in | Wt 184.0 lb

## 2018-07-22 DIAGNOSIS — F331 Major depressive disorder, recurrent, moderate: Secondary | ICD-10-CM

## 2018-07-22 DIAGNOSIS — F1120 Opioid dependence, uncomplicated: Secondary | ICD-10-CM

## 2018-07-22 DIAGNOSIS — F411 Generalized anxiety disorder: Secondary | ICD-10-CM

## 2018-07-22 MED ORDER — HYDROXYZINE HCL 25 MG PO TABS: ORAL_TABLET | ORAL | 1 refills | Status: DC

## 2018-07-22 MED ORDER — ESCITALOPRAM OXALATE 20 MG PO TABS: 30.0000 mg | ORAL_TABLET | Freq: Every day | ORAL | 1 refills | Status: DC
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
## 2018-07-23 ENCOUNTER — Ambulatory Visit: Payer: 59 | Admitting: Endocrinology

## 2018-07-30 ENCOUNTER — Ambulatory Visit: Payer: 59 | Admitting: Endocrinology

## 2018-07-30 ENCOUNTER — Telehealth: Payer: Self-pay | Admitting: Endocrinology

## 2018-07-30 NOTE — Telephone Encounter (Signed)
Patient no showed today's appt. Please advise on how to follow up. °A. No follow up necessary. °B. Follow up urgent. Contact patient immediately. °C. Follow up necessary. Contact patient and schedule visit in ___ days. °D. Follow up advised. Contact patient and schedule visit in ____weeks. ° °Would you like the NS fee to be applied to this visit? ° °

## 2018-07-30 NOTE — Telephone Encounter (Signed)
LMTCB to reschedule missed appointment °

## 2018-07-30 NOTE — Telephone Encounter (Signed)
Please schedule f/u appt for next available appointment  

## 2018-07-30 NOTE — Telephone Encounter (Signed)
Please refer to Dr. Ellison's response 

## 2018-08-12 ENCOUNTER — Ambulatory Visit (INDEPENDENT_AMBULATORY_CARE_PROVIDER_SITE_OTHER): Payer: 59 | Admitting: Endocrinology

## 2018-08-12 ENCOUNTER — Encounter: Payer: Self-pay | Admitting: Endocrinology

## 2018-08-12 VITALS — BP 120/64 | HR 95 | Ht 67.0 in | Wt 191.0 lb

## 2018-08-12 DIAGNOSIS — E109 Type 1 diabetes mellitus without complications: Secondary | ICD-10-CM

## 2018-08-12 LAB — POCT GLYCOSYLATED HEMOGLOBIN (HGB A1C): Hemoglobin A1C: 6.1 % — AB (ref 4.0–5.6)

## 2018-08-12 MED ORDER — INSULIN GLARGINE 100 UNIT/ML SOLOSTAR PEN: 37.0000 [IU] | PEN_INJECTOR | SUBCUTANEOUS | 11 refills | Status: DC

## 2018-08-12 NOTE — Progress Notes (Signed)
Subjective:    Patient ID: Dalton Jackson, male    DOB: [birth year], 23 y.o.   MRN: [id number]  HPI Pt returns for f/u of diabetes mellitus: DM type: 1 Dx'ed: 2014 Complications: none Therapy: insulin since soon after dx DKA: never Severe hypoglycemia: never Pancreatitis: never Pancreatic imaging: normal on 2017 CT Other: He works as a Museum/gallery exhibitions officer, 2nd shift; he took pump rx 2014-2017; depression and drug use has compromised self-care.  Interval history: pt states he feels well in general.  no cbg record, but states cbg's vary from 50-225.  He had mild fasting hypoglycemia almost daily, but it is less since he reduced the lantus.  He takes lantus 38 units qd, and he averages a total of approx 10 units of novolog per day.  Past Medical History:  Diagnosis Date  . Arnold-Chiari malformation, type II (HCC)    outgrew  . Diabetes mellitus without complication (HCC)   . Diabetes mellitus, type II [facility])     Past Surgical History:  Procedure Laterality Date  . TONSILLECTOMY AND ADENOIDECTOMY      Social History   Socioeconomic History  . Marital status: Single    Spouse name: Not on file  . Number of children: Not on file  . Years of education: Not on file  . Highest education level: Not on file  Occupational History  . Not on file  Social Needs  . Financial resource strain: Not on file  . Food insecurity:    Worry: Not on file    Inability: Not on file  . Transportation needs:    Medical: Not on file    Non-medical: Not on file  Tobacco Use  . Smoking status: Never Smoker  . Smokeless tobacco: Never Used  Substance and Sexual Activity  . Alcohol use: Yes    Alcohol/week: 12.0 standard drinks    Types: 12 Cans of beer per week  . Drug use: Not Currently    Frequency: 300.0 times per week    Types: Hydrocodone, Hydromorphone, Oxycodone  . Sexual activity: Not on file  Lifestyle  . Physical activity:    Days per week: Not on file    Minutes per session: Not  on file  . Stress: Not on file  Relationships  . Social connections:    Talks on phone: Not on file    Gets together: Not on file    Attends religious service: Not on file    Active member of club or organization: Not on file    Attends meetings of clubs or organizations: Not on file    Relationship status: Not on file  . Intimate partner violence:    Fear of current or ex partner: Not on file    Emotionally abused: Not on file    Physically abused: Not on file    Forced sexual activity: Not on file  Other Topics Concern  . Not on file  Social History Narrative  . Not on file    Current Outpatient Medications on File Prior to Visit  Medication Sig Dispense Refill  . buprenorphine-naloxone (SUBOXONE) 8-2 mg SUBL SL tablet Place 1 tablet under the tongue 2 (two) times daily.    Marland Kitchen EPINEPHrine (EPIPEN) 0.3 mg/0.3 mL IJ SOAJ injection Inject 0.3 mg into the muscle once. PRN FOR ALLERGIC REACTION    . escitalopram (LEXAPRO) 20 MG tablet Take 1.5 tablets (30 mg total) by mouth daily. 45 tablet 1  . Eszopiclone 3 MG TABS Take 1 tablet  by mouth at bedtime.    . Eszopiclone 3 MG TABS Take 1 tablet (3 mg total) by mouth at bedtime. 30 tablet 1  . glucose blood (ONETOUCH VERIO) test strip 1 each by Other route 4 (four) times daily. And lancets 1/day 100 each 12  . hydrOXYzine (ATARAX/VISTARIL) 25 MG tablet Take I twice a day if needed for anxiety 60 tablet 1  . insulin lispro (HUMALOG) 100 UNIT/ML injection As needed, for a total of 10 units per day, and syringes 1/day 10 mL 11  . loratadine (CLARITIN) 10 MG tablet Take 1 tablet (10 mg total) by mouth daily.    . montelukast (SINGULAIR) 10 MG tablet Take 1 tablet (10 mg total) by mouth at bedtime. 90 tablet 3  . ONETOUCH DELICA LANCETS 33G MISC To check blood sugar daily. DX: E10.9 100 each 11  . acetone, urine, test strip 1 strip by Does not apply route.    Marland Kitchen glucagon 1 MG injection Inject 1 mg into the skin.     No current  facility-administered medications on file prior to visit.     Allergies  Allergen Reactions  . Other Anaphylaxis    Melons  . Pertussis Vaccine     Family History  Problem Relation Age of Onset  . Thyroid disease Mother   . Hyperlipidemia Mother   . Alcohol abuse Paternal Grandfather     BP 120/64 (BP Location: Left Arm, Patient Position: Sitting, Cuff Size: Normal)   Pulse 95   Ht 5\' 7"  (1.702 m)   Wt 191 lb (86.6 kg)   SpO2 98%   BMI 29.91 kg/m   Review of Systems Denies LOC.     Objective:   Physical Exam VITAL SIGNS:  See vs page GENERAL: no distress Pulses: dorsalis pedis intact bilat.   MSK: no deformity of the feet CV: no leg edema Skin:  no ulcer on the feet.  normal color and temp on the feet. Neuro: sensation is intact to touch on the feet  Lab Results  Component Value Date   HGBA1C 6.1 (A) 08/12/2018       Assessment & Plan:  Type 1 DM: overcontrolled  Patient Instructions  check your blood sugar twice a day.  vary the time of day when you check, between before the 3 meals, and at bedtime.  also check if you have symptoms of your blood sugar being too high or too low.  please keep a record of the readings and bring it to your next appointment here (or you can bring the meter itself).  You can write it on any piece of paper.  please call us sooner if your blood sugar goes below 70, or if you have a lot of readings over 200.   Please reduce lantus to 37 units each morning, and: continue the same novolog.    On this type of insulin schedule, you should eat meals on a regular schedule.  If a meal is missed or significantly delayed, your blood sugar could go low. Please come back for a follow-up appointment in 3 months.

## 2018-08-12 NOTE — Patient Instructions (Addendum)
check your blood sugar twice a day.  vary the time of day when you check, between before the 3 meals, and at bedtime.  also check if you have symptoms of your blood sugar being too high or too low.  please keep a record of the readings and bring it to your next appointment here (or you can bring the meter itself).  You can write it on any piece of paper.  please call us sooner if your blood sugar goes below 70, or if you have a lot of readings over 200.   Please reduce lantus to 37 units each morning, and: continue the same novolog.    On this type of insulin schedule, you should eat meals on a regular schedule.  If a meal is missed or significantly delayed, your blood sugar could go low. Please come back for a follow-up appointment in 3 months.

## 2018-09-17 ENCOUNTER — Ambulatory Visit (INDEPENDENT_AMBULATORY_CARE_PROVIDER_SITE_OTHER): Payer: 59 | Admitting: Psychiatry

## 2018-09-17 ENCOUNTER — Encounter (HOSPITAL_COMMUNITY): Payer: Self-pay | Admitting: Psychiatry

## 2018-09-17 DIAGNOSIS — F331 Major depressive disorder, recurrent, moderate: Secondary | ICD-10-CM

## 2018-09-17 DIAGNOSIS — F411 Generalized anxiety disorder: Secondary | ICD-10-CM

## 2018-09-17 DIAGNOSIS — F1320 Sedative, hypnotic or anxiolytic dependence, uncomplicated: Secondary | ICD-10-CM

## 2018-09-17 DIAGNOSIS — F1120 Opioid dependence, uncomplicated: Secondary | ICD-10-CM | POA: Diagnosis not present

## 2018-09-17 MED ORDER — HYDROXYZINE HCL 25 MG PO TABS: ORAL_TABLET | ORAL | 1 refills | Status: DC

## 2018-09-17 MED ORDER — ESCITALOPRAM OXALATE 20 MG PO TABS: 30.0000 mg | ORAL_TABLET | Freq: Every day | ORAL | 1 refills | Status: DC
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
## 2018-10-05 ENCOUNTER — Telehealth (HOSPITAL_COMMUNITY): Payer: Self-pay | Admitting: Psychiatry

## 2018-10-05 NOTE — Telephone Encounter (Signed)
Left vm informing patient of everything Dr. Akhtar stated in previous message.  

## 2018-10-05 NOTE — Telephone Encounter (Signed)
Wants to know if it is ok to come off some of his medications to participate in the "[facility] challenge  CB # [phone]

## 2018-10-05 NOTE — Telephone Encounter (Signed)
Should avoid taking off from lexapro since it was helping. Can take off from lunesta if sleeps ok. Cut down benadryl use to one if need basis.

## 2018-11-12 ENCOUNTER — Ambulatory Visit: Payer: 59 | Admitting: Endocrinology

## 2018-11-20 ENCOUNTER — Other Ambulatory Visit (HOSPITAL_COMMUNITY): Payer: Self-pay

## 2018-11-20 ENCOUNTER — Telehealth: Payer: Self-pay

## 2018-11-20 MED ORDER — ESCITALOPRAM OXALATE 20 MG PO TABS: 30.0000 mg | ORAL_TABLET | Freq: Every day | ORAL | 0 refills | Status: DC

## 2018-11-20 NOTE — Telephone Encounter (Signed)
LOV 08/12/18. Per Dr. Loanne Drilling, f/u in 3 mo. Called pt to reschedule appt. LVM requesting returned call.

## 2018-11-23 ENCOUNTER — Ambulatory Visit (HOSPITAL_COMMUNITY): Payer: 59 | Admitting: Psychiatry

## 2018-11-25 ENCOUNTER — Telehealth: Payer: Self-pay

## 2018-11-25 NOTE — Telephone Encounter (Signed)
LOV 08/12/18. Per Dr. Loanne Drilling, f/u in 3 mo. Called pt to schedule appt. LVM requesting returned call.

## 2018-12-02 ENCOUNTER — Other Ambulatory Visit: Payer: Self-pay | Admitting: Endocrinology

## 2018-12-02 NOTE — Telephone Encounter (Signed)
Insurance covers Humalog or Novolog

## 2018-12-02 NOTE — Telephone Encounter (Signed)
Patients mother called to advise that patient is in rehab in [place].  He is not able to do an in person visit or is probably not at a point in the program to do a virtual visit. Mother states that patient is out of insulin and needs a refill.  An additional caveat is that the insurance no longer covers Humalog and the insulin needs to be changed to Humalog since that is what is covered by insurance at this time.  Rehab folks have told her that they can pick up an RX for the patient in [place].  Call back number is [phone]

## 2018-12-03 ENCOUNTER — Telehealth: Payer: Self-pay | Admitting: Endocrinology

## 2018-12-03 NOTE — Telephone Encounter (Signed)
This message was not routed back with response. Pt dad called today for follow up.  LOV 08/12/18.   Pt dad states his son is in Drug Rehab in [place]. Will be there until December of this year. States pt is out of insulin and in need of a refill. States insurance does not Clinical biochemist. Asking for refill of Humalog be sent to [facility] [address] [place] [address]. Advised I would send this information to Dr. Loanne Drilling for him to address. Requesting returned call [phone]

## 2018-12-04 ENCOUNTER — Other Ambulatory Visit: Payer: Self-pay

## 2018-12-04 DIAGNOSIS — E109 Type 1 diabetes mellitus without complications: Secondary | ICD-10-CM

## 2018-12-04 MED ORDER — "INSULIN SYRINGE 30G X 5/16"" 1 ML MISC": 1.0000 | Freq: Every day | 0 refills | Status: AC | PRN

## 2018-12-04 MED ORDER — INSULIN LISPRO 100 UNIT/ML ~~LOC~~ SOLN: SUBCUTANEOUS | 11 refills | Status: DC

## 2018-12-04 NOTE — Telephone Encounter (Signed)
Please refill x 1 F/u is due Please advise video f/u visit

## 2018-12-04 NOTE — Telephone Encounter (Signed)
Rx sent as ordered. Called mother and informed to schedule virtual visit in order to ensure future refills are handled in a timely manner. Verbalized acceptance and understanding.

## 2018-12-07 NOTE — Telephone Encounter (Signed)
Transferred call to CMA 

## 2018-12-21 ENCOUNTER — Encounter: Payer: Self-pay | Admitting: Endocrinology

## 2018-12-21 ENCOUNTER — Telehealth: Payer: Self-pay | Admitting: Endocrinology

## 2018-12-21 ENCOUNTER — Other Ambulatory Visit: Payer: Self-pay

## 2018-12-21 ENCOUNTER — Ambulatory Visit: Payer: Self-pay | Admitting: Endocrinology

## 2018-12-21 DIAGNOSIS — E109 Type 1 diabetes mellitus without complications: Secondary | ICD-10-CM

## 2018-12-21 MED ORDER — INSULIN ASPART 100 UNIT/ML ~~LOC~~ SOLN: 28.0000 [IU] | Freq: Every day | SUBCUTANEOUS | 2 refills | Status: DC | PRN

## 2018-12-21 NOTE — Telephone Encounter (Signed)
MEDICATION: Novolog  PHARMACY:  [facility] [id number]   IS THIS A 90 DAY SUPPLY :   IS PATIENT OUT OF MEDICATION: Yes  IF NOT; HOW MUCH IS LEFT:   LAST APPOINTMENT DATE: @6 /25/2020  NEXT APPOINTMENT DATE:@7 /14/2020  DO WE HAVE YOUR PERMISSION TO LEAVE A DETAILED MESSAGE:  OTHER COMMENTS:    **Let patient know to contact pharmacy at the end of the day to make sure medication is ready. **  ** Please notify patient to allow 48-72 hours to process**  **Encourage patient to contact the pharmacy for refills or they can request refills through [facility]**

## 2018-12-21 NOTE — Telephone Encounter (Signed)
insulin aspart (NOVOLOG) 100 UNIT/ML injection 8 mL 2 12/21/2018    Sig - Route: Inject 28 Units into the skin daily as needed for high blood sugar. - Subcutaneous   Sent to pharmacy as: insulin aspart (NOVOLOG) 100 UNIT/ML injection   E-Prescribing Status: Receipt confirmed by pharmacy (12/21/2018 2:47 PM EDT)

## 2018-12-22 ENCOUNTER — Ambulatory Visit (INDEPENDENT_AMBULATORY_CARE_PROVIDER_SITE_OTHER): Payer: Self-pay | Admitting: Endocrinology

## 2018-12-22 ENCOUNTER — Other Ambulatory Visit: Payer: Self-pay

## 2018-12-22 DIAGNOSIS — E109 Type 1 diabetes mellitus without complications: Secondary | ICD-10-CM

## 2018-12-22 MED ORDER — NOVOLOG FLEXPEN 100 UNIT/ML ~~LOC~~ SOPN: 4.0000 [IU] | PEN_INJECTOR | Freq: Three times a day (TID) | SUBCUTANEOUS | 3 refills | Status: DC

## 2018-12-22 MED ORDER — LANTUS SOLOSTAR 100 UNIT/ML ~~LOC~~ SOPN: 42.0000 [IU] | PEN_INJECTOR | SUBCUTANEOUS | 3 refills | Status: DC

## 2018-12-22 NOTE — Patient Instructions (Addendum)
check your blood sugar twice a day.  vary the time of day when you check, between before the 3 meals, and at bedtime.  also check if you have symptoms of your blood sugar being too high or too low.  please keep a record of the readings and bring it to your next appointment here (or you can bring the meter itself).  You can write it on any piece of paper.  please call us sooner if your blood sugar goes below 70, or if you have a lot of readings over 200.   Please continue the same insulins for now.  I have sent a prescription to your pharmacy, for pens.   On this type of insulin schedule, you should eat meals on a regular schedule.  If a meal is missed or significantly delayed, your blood sugar could go low.  Please come back for a follow-up appointment in 1 month.

## 2018-12-22 NOTE — Progress Notes (Signed)
Subjective:    Patient ID: Dalton Jackson, male    DOB: [birth year], 23 y.o.   MRN: [id number]  HPI  telehealth visit today via doxy video visit.  Alternatives to telehealth are presented to this patient, and the patient agrees to the telehealth visit. Pt is advised of the cost of the visit, and agrees to this, also.   Patient is in [place] facility, and I am at the office.   Persons attending the telehealth visit: the patient, his pastor, and I.   Pt returns for f/u of diabetes mellitus: DM type: 1 Dx'ed: 6720 Complications: none Therapy: insulin since soon after dx DKA: never Severe hypoglycemia: never Pancreatitis: never Pancreatic imaging: normal on 2017 CT Other: He works as a Games developer, 2nd shift; he took pump rx 2014-2017; depression and drug use has compromised self-care.  Interval history: Pt says he ran out of the lantus, then later, the novolog.  Pt says when he is on both insulins, cbg's are in the 200's.  Again when he is on both insulins, he averages approx 25 units of novolog per day.  He seldom has hypoglycemia, and these are mild.  He expects to be in [place] x 6 more months.  Pt says in current facility, he does not control his own insulin, and it is sometimes brought to him late.  He says he'll be in a different facility soon, where he keeps and takes his own insulin. Also, pt says he'll have better access to insulin if it is in pens.   Past Medical History:  Diagnosis Date  . Arnold-Chiari malformation, type II ([place])    outgrew  . Diabetes mellitus without complication ([place])   . Diabetes mellitus, type II [facility])     Past Surgical History:  Procedure Laterality Date  . TONSILLECTOMY AND ADENOIDECTOMY      Social History   Socioeconomic History  . Marital status: Single    Spouse name: Not on file  . Number of children: Not on file  . Years of education: Not on file  . Highest education level: Not on file  Occupational History  . Not on file   Social Needs  . Financial resource strain: Not on file  . Food insecurity    Worry: Not on file    Inability: Not on file  . Transportation needs    Medical: Not on file    Non-medical: Not on file  Tobacco Use  . Smoking status: Never Smoker  . Smokeless tobacco: Never Used  Substance and Sexual Activity  . Alcohol use: Yes    Alcohol/week: 12.0 standard drinks    Types: 12 Cans of beer per week  . Drug use: Not Currently    Frequency: 300.0 times per week    Types: Hydrocodone, Hydromorphone, Oxycodone  . Sexual activity: Not on file  Lifestyle  . Physical activity    Days per week: Not on file    Minutes per session: Not on file  . Stress: Not on file  Relationships  . Social Herbalist on phone: Not on file    Gets together: Not on file    Attends religious service: Not on file    Active member of club or organization: Not on file    Attends meetings of clubs or organizations: Not on file    Relationship status: Not on file  . Intimate partner violence    Fear of current or ex partner: Not on file  Emotionally abused: Not on file    Physically abused: Not on file    Forced sexual activity: Not on file  Other Topics Concern  . Not on file  Social History Narrative  . Not on file    Current Outpatient Medications on File Prior to Visit  Medication Sig Dispense Refill  . EPINEPHrine (EPIPEN) 0.3 mg/0.3 mL IJ SOAJ injection Inject 0.3 mg into the muscle once. PRN FOR ALLERGIC REACTION    . glucose blood (ONETOUCH VERIO) test strip 1 each by Other route 4 (four) times daily. And lancets 1/day 100 each 12  . Insulin Syringe-Needle U-100 (INSULIN SYRINGE 1CC/30GX5/16") 30G X 5/16" 1 ML MISC 1 each by Does not apply route daily as needed. 90 each 0  . ONETOUCH DELICA LANCETS 33G MISC To check blood sugar daily. DX: E10.9 100 each 11  . glucagon 1 MG injection Inject 1 mg into the skin.     No current facility-administered medications on file prior to visit.      Allergies  Allergen Reactions  . Other Anaphylaxis    Melons  . Pertussis Vaccine     Family History  Problem Relation Age of Onset  . Thyroid disease Mother   . Hyperlipidemia Mother   . Alcohol abuse Paternal Grandfather     Wt 200 lb (90.7 kg)   BMI 31.32 kg/m    Review of Systems Denies LOC    Objective:   Physical Exam       Assessment & Plan:  Type 1 DM:  Hypoglycemia: this limits aggressiveness of glycemic control Institutionalized status: pt says this is compromising his rx.  However, we'll continue the same insulin for now, as he says this will change soon.  Patient Instructions  check your blood sugar twice a day.  vary the time of day when you check, between before the 3 meals, and at bedtime.  also check if you have symptoms of your blood sugar being too high or too low.  please keep a record of the readings and bring it to your next appointment here (or you can bring the meter itself).  You can write it on any piece of paper.  please call us sooner if your blood sugar goes below 70, or if you have a lot of readings over 200.   Please continue the same insulins for now.  I have sent a prescription to your pharmacy, for pens.   On this type of insulin schedule, you should eat meals on a regular schedule.  If a meal is missed or significantly delayed, your blood sugar could go low.  Please come back for a follow-up appointment in 1 month.

## 2019-03-02 ENCOUNTER — Telehealth: Payer: Self-pay | Admitting: Endocrinology

## 2019-03-02 ENCOUNTER — Other Ambulatory Visit: Payer: Self-pay

## 2019-03-02 DIAGNOSIS — E109 Type 1 diabetes mellitus without complications: Secondary | ICD-10-CM

## 2019-03-02 MED ORDER — LANTUS SOLOSTAR 100 UNIT/ML ~~LOC~~ SOPN: 42.0000 [IU] | PEN_INJECTOR | SUBCUTANEOUS | 0 refills | Status: DC

## 2019-03-16 ENCOUNTER — Telehealth: Payer: Self-pay | Admitting: Endocrinology

## 2019-03-16 ENCOUNTER — Other Ambulatory Visit: Payer: Self-pay

## 2019-03-16 DIAGNOSIS — E109 Type 1 diabetes mellitus without complications: Secondary | ICD-10-CM

## 2019-03-16 MED ORDER — LANTUS SOLOSTAR 100 UNIT/ML ~~LOC~~ SOPN: 42.0000 [IU] | PEN_INJECTOR | SUBCUTANEOUS | 0 refills | Status: DC

## 2019-03-16 NOTE — Telephone Encounter (Signed)
Insulin Glargine (LANTUS SOLOSTAR) 100 UNIT/ML Solostar Pen 12 mL 0 03/16/2019    Sig - Route: Inject 42 Units into the skin every morning. MUST CALL TO SCHEDULE APPT - Subcutaneous   Sent to pharmacy as: Insulin Glargine (LANTUS SOLOSTAR) 100 UNIT/ML Solostar Pen   E-Prescribing Status: Receipt confirmed by pharmacy (03/16/2019 12:47 PM EDT)

## 2019-03-16 NOTE — Telephone Encounter (Signed)
MEDICATION: Lantus  PHARMACY:  [facility] [address] in [place] :    IS PATIENT OUT OF MEDICATION: yes  IF NOT; HOW MUCH IS LEFT:   LAST APPOINTMENT DATE: @9 /22/2020  NEXT APPOINTMENT DATE:@Visit  date not found  DO WE HAVE YOUR PERMISSION TO LEAVE A DETAILED MESSAGE:  OTHER COMMENTS:  Per patient his RX change from 42 units to 44 units per day and he needs the RX to be updated and sent to his pharmacy   **Let patient know to contact pharmacy at the end of the day to make sure medication is ready. **  ** Please notify patient to allow 48-72 hours to process**  **Encourage patient to contact the pharmacy for refills or they can request refills through [facility]**

## 2019-04-05 ENCOUNTER — Emergency Department (HOSPITAL_COMMUNITY): Payer: Managed Care, Other (non HMO)

## 2019-04-05 ENCOUNTER — Encounter (HOSPITAL_COMMUNITY): Payer: Self-pay | Admitting: Emergency Medicine

## 2019-04-05 ENCOUNTER — Emergency Department (HOSPITAL_COMMUNITY)
Admission: EM | Admit: 2019-04-05 | Discharge: 2019-04-06 | Disposition: A | Discharge status: Home / Self Care | Payer: Managed Care, Other (non HMO) | Attending: Emergency Medicine | Admitting: Emergency Medicine

## 2019-04-05 ENCOUNTER — Other Ambulatory Visit: Payer: Self-pay

## 2019-04-05 DIAGNOSIS — Z5321 Procedure and treatment not carried out due to patient leaving prior to being seen by health care provider: Secondary | ICD-10-CM | POA: Insufficient documentation

## 2019-04-05 DIAGNOSIS — M25532 Pain in left wrist: Secondary | ICD-10-CM | POA: Diagnosis present

## 2019-04-05 HISTORY — DX: Alcohol dependence, uncomplicated: F10.20

## 2019-04-05 HISTORY — DX: Other psychoactive substance abuse, uncomplicated: F19.10
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
## 2019-04-06 NOTE — ED Notes (Addendum)
Pt was called for in the lobby and outside to be seen in [place] by Reather Converse, MD  x1. Pt was noted to be leaving the department by other pts. Will call again prior to dc.

## 2019-04-06 NOTE — ED Notes (Signed)
Pt called with no answer x2.  

## 2019-12-28 ENCOUNTER — Other Ambulatory Visit: Payer: Self-pay | Admitting: Endocrinology

## 2019-12-28 DIAGNOSIS — E109 Type 1 diabetes mellitus without complications: Secondary | ICD-10-CM

## 2020-01-28 ENCOUNTER — Other Ambulatory Visit: Payer: Self-pay

## 2020-01-28 ENCOUNTER — Telehealth: Payer: Self-pay | Admitting: Endocrinology

## 2020-01-28 DIAGNOSIS — E109 Type 1 diabetes mellitus without complications: Secondary | ICD-10-CM

## 2020-01-28 MED ORDER — LANTUS SOLOSTAR 100 UNIT/ML ~~LOC~~ SOPN: 42.0000 [IU] | PEN_INJECTOR | SUBCUTANEOUS | 0 refills | Status: DC

## 2020-01-28 NOTE — Telephone Encounter (Signed)
Medication Refill Request   Did you call your pharmacy and request this refill first? YES   If patient has not contacted pharmacy first, instruct them to do so for future refills.   Remind them that contacting the pharmacy for their refill is the quickest method to get the refill.   Refill policy also stated that it will take anywhere between 24-72 hours to receive the refill.    Name of medication? Lantus Solostar   Is this a 90 day supply?   Name and location of pharmacy? [facility] [address] in [place]  Patient did make an appointment for Tuesday February 08, 2020

## 2020-01-28 NOTE — Telephone Encounter (Signed)
Outpatient Medication Detail   Disp Refills Start End   insulin glargine (LANTUS SOLOSTAR) 100 UNIT/ML Solostar Pen 6 mL 0 01/28/2020    Sig - Route: Inject 42 Units into the skin every morning. HAVE NOT SEEN IN A YEAR. WILL PROVIDE 2 WEEK SUPPLY TO ENSURE COMPLIANCE WITH ATTENDING APPT - Subcutaneous   Sent to pharmacy as: insulin glargine (LANTUS SOLOSTAR) 100 UNIT/ML Solostar Pen   E-Prescribing Status: Receipt confirmed by pharmacy (01/28/2020  3:38 PM EDT)

## 2020-02-08 ENCOUNTER — Ambulatory Visit: Payer: Managed Care, Other (non HMO) | Admitting: Endocrinology

## 2020-02-08 ENCOUNTER — Encounter: Payer: Self-pay | Admitting: Endocrinology

## 2020-02-08 ENCOUNTER — Other Ambulatory Visit: Payer: Self-pay

## 2020-02-08 VITALS — BP 110/70 | HR 91 | Ht 67.0 in | Wt 209.0 lb

## 2020-02-08 DIAGNOSIS — E109 Type 1 diabetes mellitus without complications: Secondary | ICD-10-CM

## 2020-02-08 LAB — POCT GLYCOSYLATED HEMOGLOBIN (HGB A1C): Hemoglobin A1C: 6.6 % — AB (ref 4.0–5.6)

## 2020-02-08 MED ORDER — DEXCOM G6 SENSOR MISC: 1.0000 | 3 refills | Status: DC

## 2020-02-08 MED ORDER — LANTUS SOLOSTAR 100 UNIT/ML ~~LOC~~ SOPN: 55.0000 [IU] | PEN_INJECTOR | SUBCUTANEOUS | 11 refills | Status: DC

## 2020-02-08 MED ORDER — DEXCOM G6 RECEIVER DEVI: 1.0000 | Freq: Once | 1 refills | Status: AC

## 2020-02-08 MED ORDER — DEXCOM G6 TRANSMITTER MISC: 1.0000 | Freq: Once | 1 refills | Status: AC

## 2020-02-08 NOTE — Patient Instructions (Addendum)
I have sent a prescription to your pharmacy, for the continuous glucose monitor Please continue the same insulins.   Please come back for a follow-up appointment in 2 months.

## 2020-02-08 NOTE — Progress Notes (Signed)
Subjective:    Patient ID: Dalton Jackson, male    DOB: 08/11/1995, 24 y.o.   MRN: [id number]  HPI Pt returns for f/u of diabetes mellitus:  DM type: 1 Dx'ed: 2014 Complications: none Therapy: insulin since soon after dx DKA: never Severe hypoglycemia: never Pancreatitis: never Pancreatic imaging: normal on 2017 CT Other: He works as a Museum/gallery exhibitions officer, 2nd shift; he took pump rx 2014-2017; depression and drug use has compromised self-care.  Interval history: Pt says he takes insulins as rx'ed.  He brings his meter with his cbg's which I have reviewed today.  cbg varies from 79-371.  There is no trend throughout the day. He reports hypoglycemia approx QOD. He takes lantus 55/d, and novolog TID (averages a total of approx 20-25 units per day).   Past Medical History:  Diagnosis Date  . Alcoholism (HCC)   . Arnold-Chiari malformation, type II (HCC)    outgrew  . Diabetes mellitus without complication (HCC)   . Diabetes mellitus, type II (HCC)   . Drug abuse [facility])     Past Surgical History:  Procedure Laterality Date  . TONSILLECTOMY AND ADENOIDECTOMY      Social History   Socioeconomic History  . Marital status: Single    Spouse name: Not on file  . Number of children: Not on file  . Years of education: Not on file  . Highest education level: Not on file  Occupational History  . Not on file  Tobacco Use  . Smoking status: Never Smoker  . Smokeless tobacco: Never Used  Vaping Use  . Vaping Use: Every day  Substance and Sexual Activity  . Alcohol use: Yes    Alcohol/week: 12.0 standard drinks    Types: 12 Cans of beer per week  . Drug use: Yes    Types: Hydrocodone, Hydromorphone, Oxycodone    Comment: Narcotics  . Sexual activity: Not on file  Other Topics Concern  . Not on file  Social History Narrative  . Not on file   Social Determinants of Health   Financial Resource Strain:   . Difficulty of Paying Living Expenses: Not on file  Food Insecurity:   .  Worried About Programme researcher, broadcasting/film/video in the Last Year: Not on file  . Ran Out of Food in the Last Year: Not on file  Transportation Needs:   . Lack of Transportation (Medical): Not on file  . Lack of Transportation (Non-Medical): Not on file  Physical Activity:   . Days of Exercise per Week: Not on file  . Minutes of Exercise per Session: Not on file  Stress:   . Feeling of Stress : Not on file  Social Connections:   . Frequency of Communication with Friends and Family: Not on file  . Frequency of Social Gatherings with Friends and Family: Not on file  . Attends Religious Services: Not on file  . Active Member of Clubs or Organizations: Not on file  . Attends Banker Meetings: Not on file  . Marital Status: Not on file  Intimate Partner Violence:   . Fear of Current or Ex-Partner: Not on file  . Emotionally Abused: Not on file  . Physically Abused: Not on file  . Sexually Abused: Not on file    Current Outpatient Medications on File Prior to Visit  Medication Sig Dispense Refill  . EPINEPHrine (EPIPEN) 0.3 mg/0.3 mL IJ SOAJ injection Inject 0.3 mg into the muscle once. PRN FOR ALLERGIC REACTION    .  glucose blood (ONETOUCH VERIO) test strip 1 each by Other route 4 (four) times daily. And lancets 1/day 100 each 12  . insulin aspart (NOVOLOG FLEXPEN) 100 UNIT/ML FlexPen Inject 4-10 Units into the skin 3 (three) times daily with meals. 10 pen 3  . Insulin Syringe-Needle U-100 (INSULIN SYRINGE 1CC/30GX5/16") 30G X 5/16" 1 ML MISC 1 each by Does not apply route daily as needed. 90 each 0  . ONETOUCH DELICA LANCETS 33G MISC To check blood sugar daily. DX: E10.9 100 each 11  . glucagon 1 MG injection Inject 1 mg into the skin.     No current facility-administered medications on file prior to visit.    Allergies  Allergen Reactions  . Other Anaphylaxis    Melons  . Pertussis Vaccine     Family History  Problem Relation Age of Onset  . Thyroid disease Mother   .  Hyperlipidemia Mother   . Alcohol abuse Paternal Grandfather     BP 110/70   Pulse 91   Ht 5\' 7"  (1.702 m)   Wt 209 lb (94.8 kg)   SpO2 97%   BMI 32.73 kg/m    Review of Systems Denies LOC    Objective:   Physical Exam VITAL SIGNS:  See vs page GENERAL: no distress Pulses: dorsalis pedis intact bilat.   MSK: no deformity of the feet CV: no leg edema Skin:  no ulcer on the feet.  normal color and temp on the feet.  Neuro: sensation is intact to touch on the feet.    Lab Results  Component Value Date   HGBA1C 6.6 (A) 02/08/2020       Assessment & Plan:  Type 1 DM Hypoglycemia, due to insulin: this limits aggressiveness of glycemic control.  I advised him to increase Novolog, and reduce Lantus-- he declines.  Patient Instructions  I have sent a prescription to your pharmacy, for the continuous glucose monitor Please continue the same insulins.   Please come back for a follow-up appointment in 2 months.

## 2020-02-22 ENCOUNTER — Telehealth: Payer: Self-pay

## 2020-02-22 NOTE — Telephone Encounter (Signed)
PRIOR AUTHORIZATION  PA initiation date: 02/22/20  Device: [employer] Insurance Company: [employer] Submission completed electronically through [employer] My Meds: Yes  Will await insurance response re: approval/denial.  [employer] (Key: [facility])  Your information has been sent to [facility]. [employer] (Key: [facility])  Ignatius Specking has not yet replied to your PA request. You may close this dialog, return to your dashboard, and perform other tasks.  To check for an update later, open this request again from your dashboard.  If Ignatius Specking has not replied to your request within three business days please contact [employer] at [phone].

## 2020-02-28 NOTE — Telephone Encounter (Signed)
DENIAL  Device Dexcom Sensors Insurance Company: [place] PA response: Denied Rationale: Not covered under pt insurance benefit plan  Document has been given to Dr. Everardo All for him to review and address. No changes were made by Dr. Everardo All since this is an insurance issue.

## 2020-03-25 ENCOUNTER — Other Ambulatory Visit: Payer: Self-pay | Admitting: Endocrinology

## 2020-04-13 ENCOUNTER — Ambulatory Visit: Payer: Managed Care, Other (non HMO) | Admitting: Endocrinology

## 2020-04-18 ENCOUNTER — Other Ambulatory Visit: Payer: Self-pay | Admitting: Endocrinology

## 2020-04-18 DIAGNOSIS — E109 Type 1 diabetes mellitus without complications: Secondary | ICD-10-CM

## 2020-04-28 ENCOUNTER — Ambulatory Visit: Payer: Managed Care, Other (non HMO) | Admitting: Endocrinology

## 2020-04-28 ENCOUNTER — Other Ambulatory Visit: Payer: Self-pay

## 2020-04-28 VITALS — BP 118/82 | HR 80 | Ht 70.0 in | Wt 205.4 lb

## 2020-04-28 DIAGNOSIS — E109 Type 1 diabetes mellitus without complications: Secondary | ICD-10-CM | POA: Diagnosis not present

## 2020-04-28 LAB — POCT GLYCOSYLATED HEMOGLOBIN (HGB A1C): Hemoglobin A1C: 7 % — AB (ref 4.0–5.6)

## 2020-04-28 MED ORDER — INSULIN ASPART FLEXPEN 100 UNIT/ML ~~LOC~~ SOPN: 4.0000 [IU] | PEN_INJECTOR | Freq: Three times a day (TID) | SUBCUTANEOUS | 3 refills | Status: DC

## 2020-04-28 MED ORDER — FREESTYLE LIBRE 2 READER DEVI: 1.0000 | Freq: Once | 1 refills | Status: AC

## 2020-04-28 MED ORDER — LANTUS SOLOSTAR 100 UNIT/ML ~~LOC~~ SOPN: 55.0000 [IU] | PEN_INJECTOR | SUBCUTANEOUS | 3 refills | Status: DC

## 2020-04-28 MED ORDER — FREESTYLE LIBRE 2 SENSOR MISC: 1.0000 | 3 refills | Status: DC

## 2020-04-28 NOTE — Patient Instructions (Addendum)
I have sent a prescription to your pharmacy, for the [facility] continuous glucose monitor reader and sensors.   Please continue the same insulins.   Please come back for a follow-up appointment in 3 months.

## 2020-04-28 NOTE — Progress Notes (Signed)
Subjective:    Patient ID: Dalton Jackson, male    DOB: 07/08/1995, 24 y.o.   MRN: [id number]  HPI Pt returns for f/u of diabetes mellitus:  DM type: 1 Dx'ed: 2014 Complications: none Therapy: insulin since soon after dx DKA: never Severe hypoglycemia: never Pancreatitis: never Pancreatic imaging: normal on 2017 CT.   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  Interval history: Pt says he seldom misses insulin doses.  no cbg record, but states cbg varies from 65-268.  There is no trend throughout the day. He reports hypoglycemia approx QOD. He takes lantus 55/d, and novolog 4-20 units (1 unit/10 g CHO, and approx 1 extra for each 25 units by which cbg exceeds 100), 3 times a day (just before each meal).  Ins declined dexcom continuous glucose monitor, so he wants [place] to try sending [place] brand.  He has mild hypoglycemia approx twice per week.   Past Medical History:  Diagnosis Date  . Alcoholism (HCC)   . Arnold-Chiari malformation, type II (HCC)    outgrew  . Diabetes mellitus without complication (HCC)   . Diabetes mellitus, type II (HCC)   . Drug abuse [facility])     Past Surgical History:  Procedure Laterality Date  . TONSILLECTOMY AND ADENOIDECTOMY      Social History   Socioeconomic History  . Marital status: Single    Spouse name: Not on file  . Number of children: Not on file  . Years of education: Not on file  . Highest education level: Not on file  Occupational History  . Not on file  Tobacco Use  . Smoking status: Never Smoker  . Smokeless tobacco: Never Used  Vaping Use  . Vaping Use: Every day  Substance and Sexual Activity  . Alcohol use: Yes    Alcohol/week: 12.0 standard drinks    Types: 12 Cans of beer per week  . Drug use: Yes    Types: Hydrocodone, Hydromorphone, Oxycodone    Comment: Narcotics  . Sexual activity: Not on file  Other Topics Concern  . Not on  file  Social History Narrative  . Not on file   Social Determinants of Health   Financial Resource Strain:   . Difficulty of Paying Living Expenses: Not on file  Food Insecurity:   . Worried About Programme researcher, broadcasting/film/video in the Last Year: Not on file  . Ran Out of Food in the Last Year: Not on file  Transportation Needs:   . Lack of Transportation (Medical): Not on file  . Lack of Transportation (Non-Medical): Not on file  Physical Activity:   . Days of Exercise per Week: Not on file  . Minutes of Exercise per Session: Not on file  Stress:   . Feeling of Stress : Not on file  Social Connections:   . Frequency of Communication with Friends and Family: Not on file  . Frequency of Social Gatherings with Friends and Family: Not on file  . Attends Religious Services: Not on file  . Active Member of Clubs or Organizations: Not on file  . Attends Banker Meetings: Not on file  . Marital Status: Not on file  Intimate Partner Violence:   . Fear of Current or Ex-Partner: Not on file  . Emotionally Abused: Not on file  . Physically Abused: Not on file  . Sexually Abused: Not on file  Current Outpatient Medications on File Prior to Visit  Medication Sig Dispense Refill  . EPINEPHrine (EPIPEN) 0.3 mg/0.3 mL IJ SOAJ injection Inject 0.3 mg into the muscle once. PRN FOR ALLERGIC REACTION    . Insulin Syringe-Needle U-100 (INSULIN SYRINGE 1CC/30GX5/16") 30G X 5/16" 1 ML MISC 1 each by Does not apply route daily as needed. 90 each 0  . Continuous Blood Gluc Sensor (DEXCOM G6 SENSOR) MISC 1 Device by Does not apply route See admin instructions. 1 every 10 days 10 each 3  . glucagon 1 MG injection Inject 1 mg into the skin.    Marland Kitchen glucose blood (ONETOUCH VERIO) test strip 1 each by Other route 4 (four) times daily. And lancets 1/day (Patient not taking: Reported on 04/28/2020) 100 each 12  . ONETOUCH DELICA LANCETS 33G MISC To check blood sugar daily. DX: E10.9 (Patient not taking:  Reported on 04/28/2020) 100 each 11   No current facility-administered medications on file prior to visit.    Allergies  Allergen Reactions  . Other Anaphylaxis    Melons  . Pertussis Vaccine     Family History  Problem Relation Age of Onset  . Thyroid disease Mother   . Hyperlipidemia Mother   . Alcohol abuse Paternal Grandfather     BP 118/82   Pulse 80   Ht 5\' 10"  (1.778 m)   Wt 205 lb 6.4 oz (93.2 kg)   SpO2 98%   BMI 29.47 kg/m   Review of Systems Denies LOC    Objective:   Physical Exam VITAL SIGNS:  See vs page GENERAL: no distress Pulses: dorsalis pedis intact bilat.   MSK: no deformity of the feet.   CV: no leg edema.   Skin:  no ulcer on the feet.  normal color and temp on the feet.   Neuro: sensation is intact to touch on the feet.    A1c=7.0%     Assessment & Plan:  Type 1 DM Hypoglycemia, due to insulin: this limits aggressiveness of glycemic control  Patient Instructions  I have sent a prescription to your pharmacy, for the [facility] continuous glucose monitor reader and sensors.   Please continue the same insulins.   Please come back for a follow-up appointment in 3 months.

## 2020-05-06 ENCOUNTER — Encounter: Payer: Self-pay | Admitting: Endocrinology
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
## 2020-05-18 ENCOUNTER — Other Ambulatory Visit: Payer: Self-pay | Admitting: Endocrinology

## 2020-05-18 MED ORDER — ACETONE (URINE) TEST VI STRP: 1.0000 | ORAL_STRIP | 0 refills | Status: AC | PRN

## 2020-07-05 ENCOUNTER — Other Ambulatory Visit: Payer: Self-pay | Admitting: *Deleted

## 2020-07-05 DIAGNOSIS — E109 Type 1 diabetes mellitus without complications: Secondary | ICD-10-CM

## 2020-07-10 ENCOUNTER — Telehealth: Payer: Self-pay | Admitting: Endocrinology

## 2020-07-10 MED ORDER — BASAGLAR KWIKPEN 100 UNIT/ML ~~LOC~~ SOPN: 55.0000 [IU] | PEN_INJECTOR | Freq: Every day | SUBCUTANEOUS | 3 refills | Status: DC

## 2020-07-10 NOTE — Telephone Encounter (Signed)
I favor Basaglar, as it is the same as Lantus.  I have sent a prescription to your pharmacy.  I'll see you next time.

## 2020-07-10 NOTE — Telephone Encounter (Signed)
Pt called to request a refill on his insulin. Pt said he did his research on what his insurance will cover and they will cover: Levemir, Basaglar, or [place]. Max limit is for 90 days anymore would require PA.   Has taken Levemir before so is familiar but can switch to any of those you recommend.  PHARMACY:  [id number] - [place], [place] - [address] AT [facility] [address] Phone:  [phone]  Fax:  [phone]

## 2020-07-10 NOTE — Telephone Encounter (Signed)
Please advise 

## 2020-07-28 ENCOUNTER — Other Ambulatory Visit: Payer: Self-pay

## 2020-08-01 ENCOUNTER — Encounter: Payer: Self-pay | Admitting: Endocrinology

## 2020-08-01 ENCOUNTER — Other Ambulatory Visit: Payer: Self-pay

## 2020-08-01 ENCOUNTER — Ambulatory Visit: Payer: Managed Care, Other (non HMO) | Admitting: Endocrinology

## 2020-08-01 VITALS — BP 110/80 | HR 72 | Ht 69.0 in | Wt 186.8 lb

## 2020-08-01 DIAGNOSIS — E109 Type 1 diabetes mellitus without complications: Secondary | ICD-10-CM

## 2020-08-01 LAB — POCT GLYCOSYLATED HEMOGLOBIN (HGB A1C): Hemoglobin A1C: 6.2 % — AB (ref 4.0–5.6)

## 2020-08-01 MED ORDER — BASAGLAR KWIKPEN 100 UNIT/ML ~~LOC~~ SOPN
33.0000 [IU] | PEN_INJECTOR | Freq: Every day | SUBCUTANEOUS | 3 refills | Status: DC
Start: 2020-08-01 — End: 2020-10-30

## 2020-08-01 NOTE — Patient Instructions (Addendum)
Please reduce the Basaglar to 33 units per day, and continue the same Novolog.   Please come back for a follow-up appointment in 3 months.

## 2020-08-01 NOTE — Progress Notes (Signed)
Subjective:    Patient ID: Dalton Jackson, male    DOB: 1996/03/20, 25 y.o.   MRN: [id number]  HPI Pt returns for f/u of diabetes mellitus:  DM type: 1 Dx'ed: 2014 Complications: none Therapy: insulin since soon after dx DKA: never Severe hypoglycemia: never.  Pancreatitis: never Pancreatic imaging: normal on 2017 CT.   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  Other: he took pump rx 2014-2017; Interval history: Pt says he seldom misses insulin doses.  I reviewed continuous glucose monitor data.  Glucose varies from 65-330.  There is no trend throughout the day. He reports hypoglycemia approx QOD. He takes lantus 35/d, and novolog 4-20 units 3 times a day (just before each meal) (1 unit/12 g CHO, and approx 1 extra for each 100 by which cbg exceeds 100).  He wants to resume pump rx.   Past Medical History:  Diagnosis Date  . Alcoholism (HCC)   . Arnold-Chiari malformation, type II (HCC)    outgrew  . Diabetes mellitus without complication (HCC)   . Diabetes mellitus, type II (HCC)   . Drug abuse [facility])     Past Surgical History:  Procedure Laterality Date  . TONSILLECTOMY AND ADENOIDECTOMY      Social History   Socioeconomic History  . Marital status: Single    Spouse name: Not on file  . Number of children: Not on file  . Years of education: Not on file  . Highest education level: Not on file  Occupational History  . Not on file  Tobacco Use  . Smoking status: Never Smoker  . Smokeless tobacco: Never Used  Vaping Use  . Vaping Use: Every day  Substance and Sexual Activity  . Alcohol use: Yes    Alcohol/week: 12.0 standard drinks    Types: 12 Cans of beer per week  . Drug use: Yes    Types: Hydrocodone, Hydromorphone, Oxycodone    Comment: Narcotics  . Sexual activity: Not on file  Other Topics Concern  . Not on file  Social History Narrative  . Not on file   Social Determinants of Health    Financial Resource Strain: Not on file  Food Insecurity: Not on file  Transportation Needs: Not on file  Physical Activity: Not on file  Stress: Not on file  Social Connections: Not on file  Intimate Partner Violence: Not on file    Current Outpatient Medications on File Prior to Visit  Medication Sig Dispense Refill  . acetone, urine, test strip 1 strip by Does not apply route as needed for high blood sugar. 25 each 0  . Continuous Blood Gluc Sensor (DEXCOM G6 SENSOR) MISC 1 Device by Does not apply route See admin instructions. 1 every 10 days 10 each 3  . Continuous Blood Gluc Sensor (FREESTYLE LIBRE 2 SENSOR) MISC 1 Device by Does not apply route every 14 (fourteen) days. 6 each 3  . EPINEPHrine 0.3 mg/0.3 mL IJ SOAJ injection Inject 0.3 mg into the muscle once. PRN FOR ALLERGIC REACTION    . glucose blood (ONETOUCH VERIO) test strip 1 each by Other route 4 (four) times daily. And lancets 1/day 100 each 12  . Insulin Aspart FlexPen 100 UNIT/ML SOPN Inject 4-20 Units into the skin 3 (three) times daily with meals. 75 mL 3  . Insulin Syringe-Needle U-100 (INSULIN SYRINGE 1CC/30GX5/16") 30G X 5/16" 1 ML MISC 1 each by Does not apply route daily as needed.  90 each 0  . ONETOUCH DELICA LANCETS 33G MISC To check blood sugar daily. DX: E10.9 100 each 11  . glucagon 1 MG injection Inject 1 mg into the skin.     No current facility-administered medications on file prior to visit.    Allergies  Allergen Reactions  . Other Anaphylaxis    Melons  . Pertussis Vaccine     Family History  Problem Relation Age of Onset  . Thyroid disease Mother   . Hyperlipidemia Mother   . Alcohol abuse Paternal Grandfather     BP 110/80 (BP Location: Right Arm, Patient Position: Sitting, Cuff Size: Normal)   Pulse 72   Ht 5\' 9"  (1.753 m)   Wt 186 lb 12.8 oz (84.7 kg)   SpO2 97%   BMI 27.59 kg/m    Review of Systems     Objective:   Physical Exam VITAL SIGNS:  See vs page GENERAL: no  distress Pulses: dorsalis pedis intact bilat.   MSK: no deformity of the feet CV: no leg edema Skin:  no ulcer on the feet.  normal color and temp on the feet. Neuro: sensation is intact to touch on the feet   Lab Results  Component Value Date   HGBA1C 6.2 (A) 08/01/2020       Assessment & Plan:  Type 1 DM: overcontrolled.  Ref to CDE, to consider resuming pump rx. Hypoglycemia, due to insulin: this limits aggressiveness of glycemic control  Patient Instructions  Please reduce the Basaglar to 33 units per day, and continue the same Novolog.   Please come back for a follow-up appointment in 3 months.

## 2020-10-30 ENCOUNTER — Encounter: Payer: Managed Care, Other (non HMO) | Attending: Endocrinology | Admitting: Nutrition

## 2020-10-30 ENCOUNTER — Other Ambulatory Visit: Payer: Self-pay

## 2020-10-30 ENCOUNTER — Ambulatory Visit: Payer: Managed Care, Other (non HMO) | Admitting: Endocrinology

## 2020-10-30 ENCOUNTER — Encounter: Payer: Self-pay | Admitting: Endocrinology

## 2020-10-30 VITALS — BP 120/70 | HR 92 | Ht 69.0 in | Wt 180.6 lb

## 2020-10-30 DIAGNOSIS — E109 Type 1 diabetes mellitus without complications: Secondary | ICD-10-CM

## 2020-10-30 DIAGNOSIS — E1065 Type 1 diabetes mellitus with hyperglycemia: Secondary | ICD-10-CM | POA: Diagnosis not present

## 2020-10-30 LAB — POCT GLYCOSYLATED HEMOGLOBIN (HGB A1C): Hemoglobin A1C: 6.4 % — AB (ref 4.0–5.6)

## 2020-10-30 MED ORDER — INSULIN GLARGINE-YFGN 100 UNIT/ML ~~LOC~~ SOPN: 34.0000 [IU] | PEN_INJECTOR | Freq: Every day | SUBCUTANEOUS | 3 refills | Status: DC

## 2020-10-30 NOTE — Progress Notes (Signed)
Subjective:    Patient ID: Dalton Jackson, male    DOB: 03-24-1996, 25 y.o.   MRN: [id number]  HPI Pt returns for f/u of diabetes mellitus:  DM type: 1 Dx'ed: 2014 Complications: none Therapy: insulin since soon after dx DKA: never Severe hypoglycemia: never.  Pancreatitis: never Pancreatic imaging: normal on 2017 CT.   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  Other: he took pump rx 2014-2017, but he chose to stop.   Interval history: I reviewed continuous glucose monitor data.  Glucose varies from 65-340.  It is in general lowest 1AM-7AM, and highest 10AM-3PM.  He has mild hypoglycemia almost qd.  This happens in the morning or the afternoon.  He says basaglar is 36/d.   Past Medical History:  Diagnosis Date  . Alcoholism (HCC)   . Arnold-Chiari malformation, type II (HCC)    outgrew  . Diabetes mellitus without complication (HCC)   . Diabetes mellitus, type II (HCC)   . Drug abuse [facility])     Past Surgical History:  Procedure Laterality Date  . TONSILLECTOMY AND ADENOIDECTOMY      Social History   Socioeconomic History  . Marital status: Single    Spouse name: Not on file  . Number of children: Not on file  . Years of education: Not on file  . Highest education level: Not on file  Occupational History  . Not on file  Tobacco Use  . Smoking status: Never Smoker  . Smokeless tobacco: Never Used  Vaping Use  . Vaping Use: Every day  Substance and Sexual Activity  . Alcohol use: Yes    Alcohol/week: 12.0 standard drinks    Types: 12 Cans of beer per week  . Drug use: Yes    Types: Hydrocodone, Hydromorphone, Oxycodone    Comment: Narcotics  . Sexual activity: Not on file  Other Topics Concern  . Not on file  Social History Narrative  . Not on file   Social Determinants of Health   Financial Resource Strain: Not on file  Food Insecurity: Not on file  Transportation Needs: Not on file   Physical Activity: Not on file  Stress: Not on file  Social Connections: Not on file  Intimate Partner Violence: Not on file    Current Outpatient Medications on File Prior to Visit  Medication Sig Dispense Refill  . acetone, urine, test strip 1 strip by Does not apply route as needed for high blood sugar. 25 each 0  . Continuous Blood Gluc Sensor (DEXCOM G6 SENSOR) MISC 1 Device by Does not apply route See admin instructions. 1 every 10 days 10 each 3  . Continuous Blood Gluc Sensor (FREESTYLE LIBRE 2 SENSOR) MISC 1 Device by Does not apply route every 14 (fourteen) days. 6 each 3  . EPINEPHrine 0.3 mg/0.3 mL IJ SOAJ injection Inject 0.3 mg into the muscle once. PRN FOR ALLERGIC REACTION    . glucose blood (ONETOUCH VERIO) test strip 1 each by Other route 4 (four) times daily. And lancets 1/day 100 each 12  . Insulin Aspart FlexPen 100 UNIT/ML SOPN Inject 4-20 Units into the skin 3 (three) times daily with meals. 75 mL 3  . Insulin Syringe-Needle U-100 (INSULIN SYRINGE 1CC/30GX5/16") 30G X 5/16" 1 ML MISC 1 each by Does not apply route daily as needed. 90 each 0  . ONETOUCH DELICA LANCETS 33G MISC To check blood sugar daily. DX: E10.9 100 each 11  .  glucagon 1 MG injection Inject 1 mg into the skin.     No current facility-administered medications on file prior to visit.    Allergies  Allergen Reactions  . Other Anaphylaxis    Melons  . Pertussis Vaccine     Family History  Problem Relation Age of Onset  . Thyroid disease Mother   . Hyperlipidemia Mother   . Alcohol abuse Paternal Grandfather     BP 120/70 (BP Location: Right Arm, Patient Position: Sitting, Cuff Size: Normal)   Pulse 92   Ht 5\' 9"  (1.753 m)   Wt 180 lb 9.6 oz (81.9 kg)   SpO2 97%   BMI 26.67 kg/m    Review of Systems     Objective:   Physical Exam VITAL SIGNS:  See vs page GENERAL: no distress Pulses: dorsalis pedis intact bilat.   MSK: no deformity of the feet CV: trace bilat leg edema Skin:   no ulcer on the feet.  normal color and temp on the feet. Neuro: sensation is intact to touch on the feet  Lab Results  Component Value Date   HGBA1C 6.4 (A) 10/30/2020        Assessment & Plan:  Type 1 DM: overcontrolled.  He will soon start pump rx   When pt goes on pump, he should take: basal rate of 1 unit/hr bolus of 1 unit/20 grams carbohydrate correction bolus (which some people call "sensitivity," or "insulin sensitivity ratio," or just "isr") of 1 unit for each 100 by which your glucose exceeds 100.   Patient Instructions  Please reduce the Semglee to 34/d Please continue the same Novolog. Please come back for a follow-up appointment in 2 months.

## 2020-10-30 NOTE — Patient Instructions (Addendum)
Please reduce the Semglee to 34/d Please continue the same Novolog. Please come back for a follow-up appointment in 2 months.

## 2020-11-01 NOTE — Progress Notes (Signed)
Patient is here today to review the new pumps on the market.  He used to be on a pump, and he is wanting to return to this for better blood sugar control.  He was show the 3 pump models, and we discussed the advantages and disadvantages of each model.  He reports that he knows how to count carbs, and was told to download the caloried king app for this to assist him.  He agreed to do this.  He is currently wearing a Dexcom and wants a pump that will link to this.  He was given brochures and told to review the web sites of each model.  He decided that he wanted to use the [employer] Pump.  Paperwork was filled out and faxed to [employer] He was told to call me when he receives his pump for training, and he agreed to do this.  He had no final questions.

## 2020-11-01 NOTE — Patient Instructions (Signed)
Call when you receive your pump to schedule an appointment for training

## 2020-11-29 ENCOUNTER — Encounter: Payer: Self-pay | Admitting: Endocrinology

## 2020-12-05 ENCOUNTER — Other Ambulatory Visit: Payer: Self-pay | Admitting: Endocrinology

## 2020-12-05 ENCOUNTER — Telehealth: Payer: Self-pay | Admitting: Nutrition

## 2020-12-05 MED ORDER — DEXCOM G6 TRANSMITTER MISC: 1.0000 | Freq: Once | 1 refills | Status: AC

## 2020-12-05 MED ORDER — DEXCOM G6 SENSOR MISC: 1.0000 | 3 refills | Status: DC

## 2020-12-05 NOTE — Telephone Encounter (Signed)
He still does not have his pump.  Spoke with him and he will call me when he gets this.  He will also need a script called in for Dexcom sensors (1 q 10 days) and transmitter (1 q 3 months).  Can you do this Dr. Everardo All?

## 2020-12-05 NOTE — Telephone Encounter (Signed)
Talked with patient.  Tandem pump has not arrived as yet.  He also does not have any dexcoms.  Pump start orders placed on dr. George Hugh desk with note to order dexcoms.

## 2020-12-13 ENCOUNTER — Telehealth: Payer: Self-pay | Admitting: Pharmacy Technician

## 2020-12-13 NOTE — Telephone Encounter (Addendum)
Patient Advocate Encounter   Received notification from [facility] that prior authorization for [facility] SENSOR AND TRANSMITTER is required.   PA submitted on 12/13/2020 Keys: [id number] sensor [id number]  transmitter Status is APPROVED through 12/12/2021 REF # [id number]    [facility] will continue to follow.   Netty Starring. Dimas Aguas, CPhT Patient Advocate [facility] Phone: [phone] Fax:  [phone]

## 2020-12-14 ENCOUNTER — Telehealth: Payer: Self-pay | Admitting: Dietician

## 2020-12-15 NOTE — Telephone Encounter (Signed)
Returned patient call. He now has the T-slim and is ready for training. The Dexcom is ready at the pharmacy.  He states that due to his job he is unable to make 2 appointments.  He states that he thinks that he can get the Dexcom started himself. Appointment made with Bonita Quin, CPT, CDCES for July 20.  Order sheet for pump placed on Dr. George Hugh Desk. Request for vial insulin sent to MD.  Oran Rein, RD, LDN, CDCES

## 2020-12-18 ENCOUNTER — Telehealth: Payer: Self-pay | Admitting: Nutrition

## 2020-12-18 NOTE — Telephone Encounter (Signed)
Talked with patient to discuss a new date for pump start.  He will call be back tomorrow to reschedule this upcoming appointment

## 2020-12-27 ENCOUNTER — Ambulatory Visit: Payer: Managed Care, Other (non HMO) | Admitting: Nutrition

## 2021-01-10 ENCOUNTER — Encounter: Payer: Managed Care, Other (non HMO) | Attending: Endocrinology | Admitting: Nutrition

## 2021-01-10 ENCOUNTER — Other Ambulatory Visit: Payer: Self-pay

## 2021-01-10 DIAGNOSIS — E1065 Type 1 diabetes mellitus with hyperglycemia: Secondary | ICD-10-CM | POA: Insufficient documentation

## 2021-01-10 NOTE — Patient Instructions (Signed)
Read over handouts given and manual. Call 800 help line if questions.  Call office if blood sugars drop below 70 or remain over 200.

## 2021-01-10 NOTE — Progress Notes (Signed)
Patient was trained on how to use the Tandem pump.  Settings were put in per Dr. George Hugh orders:  Basal rate: 0.7u/hr, ISF: 100, I/C 20, target 110, timing 4 hours. He was also trained on how to use the Dexcom.  This was started today and linked to his tandem pump.  T-connect account was set up and linked to [facility].   He was trained on how to bolus, change cartridge, do correction doses, and how to put the pump in start/stop, temp basal rate and sick day and high blood sugar protocols.  Handouts were given for each of the above, and patient was encouraged to read over them tonight.   He re demonstrated how to give a bolus correctly and signed off the checklist after reviewing each topic, and had not final questions.  He was told to schedule his appointment with Dr. Everardo All in 1 week.  He agreed to do this.

## 2021-01-12 ENCOUNTER — Telehealth: Payer: Self-pay | Admitting: Nutrition

## 2021-01-12 NOTE — Telephone Encounter (Signed)
Patient reports that he is having no difficulty using his pump/bolusing.  He changed out his cartridge a little early today, because his infusion set came out, but his cartridge was getting low.  He reported no difficulty doing this. Says all blood sugars are very good and he is pleased with everything so far.  He had no final questions for me.

## 2021-01-16 ENCOUNTER — Telehealth: Payer: Self-pay | Admitting: Nutrition

## 2021-01-16 NOTE — Telephone Encounter (Signed)
+  Patient reported that he did his first tubing/cartridge change, and blood sugars have been high, despite several corrections boluses--staying in the 300s.  He was told to pull the infusion set out, and he did not take off the needle cover, and no insulin was getting into his body.  I talked him through doing another infusion set change and he had no final questions.

## 2021-01-16 NOTE — Telephone Encounter (Signed)
Message left on my machine that he continues to have problems changing his infusion set.  He stopped the pump, and went back on his Lantus and injections.  I called him and scheduled another visit on Wednesday.

## 2021-01-17 ENCOUNTER — Telehealth: Payer: Self-pay | Admitting: Nutrition

## 2021-01-17 ENCOUNTER — Ambulatory Visit: Payer: Managed Care, Other (non HMO) | Admitting: Nutrition

## 2021-01-17 NOTE — Telephone Encounter (Signed)
Patient reports that he is not able to come in to office today to restart his pump, due to work conflict.  I asked him to reschedule, but he is having surgery on Monday and will call back after the surgery when he feels better.  He asked that we call in a script for Lantus, that he will not have enough to cover the next week. I told him that I would leave him 2 pens at the front desk.

## 2021-01-22 NOTE — Telephone Encounter (Signed)
Friend picked up Lantus 01/22/21 - lantus was in cabinet in front office area

## 2021-01-30 ENCOUNTER — Ambulatory Visit: Payer: Managed Care, Other (non HMO) | Admitting: Endocrinology

## 2021-02-05 ENCOUNTER — Encounter: Payer: Managed Care, Other (non HMO) | Admitting: Nutrition

## 2021-02-05 ENCOUNTER — Other Ambulatory Visit: Payer: Self-pay

## 2021-02-06 NOTE — Progress Notes (Signed)
Patient was restarted on his tandem Control IQ pump.  He had problems with his sensor placements because his abdomen has many areas of hardened tissue due to poor injection site rotation.  He was shown different areas to use, like his upper abdomen, his sides,and upper and lower buttocks areas, as well as his upper legs.  We also reviewed how to change a cartridge making sure to remove the air before adding the insulin.  He reported that he had forgotten to do this, which could have been some of the reason for unexplained high blood sugar readings.  He was also shown and given a few samples of Sure T 5mm needle infusions to try.  He inserted the infusion set into his upper left abdomen and had no final questions.

## 2021-02-06 NOTE — Patient Instructions (Signed)
Remove air in cartridge before adding insulin Use upper abdominal areas, as well as upper buttocks and lower buttocks areas, and sides of abdomens for infusion set placement

## 2021-03-16 ENCOUNTER — Ambulatory Visit: Payer: Managed Care, Other (non HMO) | Admitting: Endocrinology

## 2021-03-27 ENCOUNTER — Telehealth: Payer: Self-pay | Admitting: Endocrinology

## 2021-03-27 NOTE — Telephone Encounter (Signed)
Pt called requesting a PA for [employer] and transmitters.   Pharmacy [facility] [phone] - [place], [place] - [address] AT [address]   Pt contact [phone]

## 2021-03-28 ENCOUNTER — Telehealth: Payer: Self-pay | Admitting: Pharmacy Technician

## 2021-03-28 ENCOUNTER — Other Ambulatory Visit (HOSPITAL_COMMUNITY): Payer: Self-pay

## 2021-03-28 NOTE — Telephone Encounter (Signed)
Patient Advocate Encounter   Received notification from CoverMyMeds/the patient himself, that prior authorization for Dexcom is required. (He had a change in ins)  PA submitted on 03/28/21 Key  [id number] Status is pending    [facility] will continue to follow:   Sherilyn Dacosta, CPhT Patient Advocate [facility] Phone: [phone] Fax:  [phone]

## 2021-03-28 NOTE — Telephone Encounter (Signed)
Message sent thru MyChart to see if he has had a change in insurance

## 2021-03-30 ENCOUNTER — Encounter: Payer: Self-pay | Admitting: Endocrinology

## 2021-03-30 ENCOUNTER — Other Ambulatory Visit: Payer: Self-pay

## 2021-03-30 ENCOUNTER — Ambulatory Visit (INDEPENDENT_AMBULATORY_CARE_PROVIDER_SITE_OTHER): Payer: Managed Care, Other (non HMO) | Admitting: Endocrinology

## 2021-03-30 VITALS — BP 122/84 | HR 102 | Ht 69.0 in | Wt 179.8 lb

## 2021-03-30 DIAGNOSIS — E109 Type 1 diabetes mellitus without complications: Secondary | ICD-10-CM

## 2021-03-30 LAB — POCT GLYCOSYLATED HEMOGLOBIN (HGB A1C): Hemoglobin A1C: 6.7 % — AB (ref 4.0–5.6)

## 2021-03-30 MED ORDER — INSULIN ASPART 100 UNIT/ML IJ SOLN: INTRAMUSCULAR | 3 refills | Status: DC

## 2021-03-30 NOTE — Progress Notes (Signed)
Subjective:    Patient ID: Dalton Jackson, male    DOB: 1996-06-09, 25 y.o.   MRN: [id number]  HPI Pt returns for f/u of diabetes mellitus:  DM type: 1 Dx'ed: 2014 Complications: none Therapy: insulin since soon after dx (tandem pump since 2022) DKA: never Severe hypoglycemia: never.  Pancreatitis: never Pancreatic imaging: normal on 2017 CT.   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  Other: he took Omnipod pump rx 2014-2017, but he chose to stop.   Interval history: I reviewed continuous glucose monitor data.  Glucose varies from 45-360.  It is in general lowest at 1AM (pt says due to correction bolus) and 10:30 AM (pt says due to skipping breakfast, and no control-IQ).  It is highest at 10PM.   He takes these settings: basal rate of 1 unit/hr, when not in control IQ bolus of 1 unit/10 grams carbohydrate correction bolus (which some people call "sensitivity," or "insulin sensitivity ratio," or just "isr") of 1 unit for each 100 by which your glucose exceeds 110.  He has mild hypoglycemia approx twice per week.  He is in Control-IQ just 77% of the time.  Pt says this is due to needing PA for his sensors and transmitter.   He eats meals at 12N and 7PM.   TDD is 48 units (52% basal) Past Medical History:  Diagnosis Date   Alcoholism (HCC)    Arnold-Chiari malformation, type II (HCC)    outgrew   Diabetes mellitus without complication (HCC)    Diabetes mellitus, type II (HCC)    Drug abuse (HCC)     Past Surgical History:  Procedure Laterality Date   TONSILLECTOMY AND ADENOIDECTOMY      Social History   Socioeconomic History   Marital status: Single    Spouse name: Not on file   Number of children: Not on file   Years of education: Not on file   Highest education level: Not on file  Occupational History   Not on file  Tobacco Use   Smoking status: Never   Smokeless tobacco: Never  Vaping Use    Vaping Use: Every day  Substance and Sexual Activity   Alcohol use: Yes    Alcohol/week: 12.0 standard drinks    Types: 12 Cans of beer per week   Drug use: Yes    Types: Hydrocodone, Hydromorphone, Oxycodone    Comment: Narcotics   Sexual activity: Not on file  Other Topics Concern   Not on file  Social History Narrative   Not on file   Social Determinants of Health   Financial Resource Strain: Not on file  Food Insecurity: Not on file  Transportation Needs: Not on file  Physical Activity: Not on file  Stress: Not on file  Social Connections: Not on file  Intimate Partner Violence: Not on file    Current Outpatient Medications on File Prior to Visit  Medication Sig Dispense Refill   acetone, urine, test strip 1 strip by Does not apply route as needed for high blood sugar. 25 each 0   Continuous Blood Gluc Sensor (DEXCOM G6 SENSOR) MISC 1 Device by Does not apply route See admin instructions. 1 every 10 days 10 each 3   EPINEPHrine 0.3 mg/0.3 mL IJ SOAJ injection Inject 0.3 mg into the muscle once. PRN FOR ALLERGIC REACTION     glucose blood (ONETOUCH VERIO) test strip 1 each by Other route 4 (four) times daily.  And lancets 1/day 100 each 12   Insulin Syringe-Needle U-100 (INSULIN SYRINGE 1CC/30GX5/16") 30G X 5/16" 1 ML MISC 1 each by Does not apply route daily as needed. 90 each 0   ONETOUCH DELICA LANCETS 33G MISC To check blood sugar daily. DX: E10.9 100 each 11   sertraline (ZOLOFT) 100 MG tablet Take by mouth.     glucagon 1 MG injection Inject 1 mg into the skin.     No current facility-administered medications on file prior to visit.    Allergies  Allergen Reactions   Other Anaphylaxis    Melons   Pertussis Vaccine     Family History  Problem Relation Age of Onset   Thyroid disease Mother    Hyperlipidemia Mother    Alcohol abuse Paternal Grandfather     BP 122/84 (BP Location: Right Arm, Patient Position: Sitting, Cuff Size: Normal)   Pulse (!) 102   Ht  5\' 9"  (1.753 m)   Wt 179 lb 12.8 oz (81.6 kg)   SpO2 96%   BMI 26.55 kg/m   Review of Systems     Objective:   Physical Exam VITAL SIGNS:  See vs page GENERAL: no distress Pulses: dorsalis pedis intact bilat.   MSK: no deformity of the feet CV: trace bilat leg edema Skin:  no ulcer on the feet.  normal color and temp on the feet. Neuro: sensation is intact to touch on the feet.    Lab Results  Component Value Date   HGBA1C 6.7 (A) 03/30/2021      Assessment & Plan:  Type 1 DM Hypoglycemia, due to insulin: this limits aggressiveness of glycemic control.   Patient Instructions  Please take these settings: basal rate of 1 unit/hr, when not in control IQ bolus of 1 unit/9 grams carbohydrate correction bolus (which some people call "sensitivity," or "insulin sensitivity ratio," or just "isr") of 1 unit for each 100 by which your glucose exceeds 110.   We are doing the prior auth for your continuous glucose monitor sensors and transmitter.   Please come back for a follow-up appointment in 3 months.

## 2021-03-30 NOTE — Telephone Encounter (Signed)
Pt is asking the progress of the PA. Please let me know when you know.

## 2021-03-30 NOTE — Patient Instructions (Addendum)
Please take these settings: basal rate of 1 unit/hr, when not in control IQ bolus of 1 unit/9 grams carbohydrate correction bolus (which some people call "sensitivity," or "insulin sensitivity ratio," or just "isr") of 1 unit for each 100 by which your glucose exceeds 110.   We are doing the prior auth for your continuous glucose monitor sensors and transmitter.   Please come back for a follow-up appointment in 3 months.

## 2021-04-02 ENCOUNTER — Other Ambulatory Visit (HOSPITAL_COMMUNITY): Payer: Self-pay

## 2021-04-02 ENCOUNTER — Telehealth: Payer: Self-pay | Admitting: Pharmacy Technician

## 2021-04-02 ENCOUNTER — Other Ambulatory Visit: Payer: Self-pay | Admitting: Endocrinology

## 2021-04-02 MED ORDER — INSULIN LISPRO 100 UNIT/ML IJ SOLN: INTRAMUSCULAR | 3 refills | Status: DC

## 2021-04-02 NOTE — Telephone Encounter (Signed)
Patient Advocate Encounter   Case ID # [id number]  Called [employer] to follow up on PA. Case is under clinical review.

## 2021-04-02 NOTE — Telephone Encounter (Signed)
Message sent thru MyChart 

## 2021-04-02 NOTE — Telephone Encounter (Signed)
Patient Advocate Encounter   Received notification from CoverMyMeds that prior authorization for Novolog is required.   Per Test Claim: Ins prefers Humalog.   Sherilyn Dacosta, CPhT Patient Advocate [facility] Phone: [phone] Fax:  [phone]

## 2021-04-03 ENCOUNTER — Other Ambulatory Visit (HOSPITAL_COMMUNITY): Payer: Self-pay

## 2021-04-03 NOTE — Telephone Encounter (Addendum)
[facility] Patient Advocate Encounter  Prior Authorization for Dexcom has been approved.    PA# PA Case ID: [id number] Effective dates: 03/28/21 through 04/02/22  Patients co-pay is $35 sensors-3/30 days; $88 for transmitter 1/67 days, max 90 days. Per pharmacy.   Called pt. No answer, no VM set up.   Sherilyn Dacosta, CPhT Patient Advocate [facility] Phone: [phone] Fax:  [phone]

## 2021-04-03 NOTE — Telephone Encounter (Signed)
Message sent thru MyChart 

## 2021-07-05 ENCOUNTER — Encounter: Payer: Self-pay | Admitting: Endocrinology

## 2021-07-06 ENCOUNTER — Other Ambulatory Visit: Payer: Self-pay

## 2021-07-06 ENCOUNTER — Ambulatory Visit: Payer: Managed Care, Other (non HMO) | Admitting: Endocrinology

## 2021-07-06 VITALS — BP 124/70 | HR 112 | Ht 69.0 in | Wt 188.6 lb

## 2021-07-06 DIAGNOSIS — E109 Type 1 diabetes mellitus without complications: Secondary | ICD-10-CM | POA: Diagnosis not present

## 2021-07-06 LAB — POCT GLYCOSYLATED HEMOGLOBIN (HGB A1C): Hemoglobin A1C: 6.8 % — AB (ref 4.0–5.6)
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
## 2021-07-06 NOTE — Patient Instructions (Addendum)
Please take these settings: basal rate of 1 unit/hr, when not in control IQ bolus of 1 unit/9 grams carbohydrate.  Also take 2 extra units if you are eating late at night.   correction bolus (which some people call "sensitivity," or "insulin sensitivity ratio," or just "isr") of 1 unit for each 100 by which your glucose exceeds 110.   Please come back for a follow-up appointment in 3 months.

## 2021-10-04 ENCOUNTER — Ambulatory Visit: Payer: Managed Care, Other (non HMO) | Admitting: Endocrinology

## 2021-11-21 DIAGNOSIS — F332 Major depressive disorder, recurrent severe without psychotic features: Secondary | ICD-10-CM | POA: Diagnosis not present

## 2021-11-21 DIAGNOSIS — F4310 Post-traumatic stress disorder, unspecified: Secondary | ICD-10-CM | POA: Diagnosis not present

## 2021-11-21 DIAGNOSIS — F4323 Adjustment disorder with mixed anxiety and depressed mood: Secondary | ICD-10-CM | POA: Diagnosis not present

## 2021-12-04 DIAGNOSIS — F4323 Adjustment disorder with mixed anxiety and depressed mood: Secondary | ICD-10-CM | POA: Diagnosis not present

## 2021-12-04 DIAGNOSIS — F4310 Post-traumatic stress disorder, unspecified: Secondary | ICD-10-CM | POA: Diagnosis not present

## 2021-12-04 DIAGNOSIS — F332 Major depressive disorder, recurrent severe without psychotic features: Secondary | ICD-10-CM | POA: Diagnosis not present

## 2021-12-19 DIAGNOSIS — F4323 Adjustment disorder with mixed anxiety and depressed mood: Secondary | ICD-10-CM | POA: Diagnosis not present

## 2021-12-19 DIAGNOSIS — F332 Major depressive disorder, recurrent severe without psychotic features: Secondary | ICD-10-CM | POA: Diagnosis not present

## 2021-12-19 DIAGNOSIS — F4310 Post-traumatic stress disorder, unspecified: Secondary | ICD-10-CM | POA: Diagnosis not present

## 2021-12-24 ENCOUNTER — Telehealth: Payer: Self-pay | Admitting: Pharmacy Technician

## 2021-12-24 ENCOUNTER — Other Ambulatory Visit (HOSPITAL_COMMUNITY): Payer: Self-pay

## 2021-12-24 NOTE — Telephone Encounter (Signed)
Patient Advocate Encounter   Received notification from CoverMyMeds that prior authorization for Dexcom Sensor and transmitter is due for renewal.  PA on file through 04/02/22. Archived request for now. Too soon to get new authorization.

## 2022-01-02 DIAGNOSIS — F4310 Post-traumatic stress disorder, unspecified: Secondary | ICD-10-CM | POA: Diagnosis not present

## 2022-01-02 DIAGNOSIS — F4323 Adjustment disorder with mixed anxiety and depressed mood: Secondary | ICD-10-CM | POA: Diagnosis not present

## 2022-01-02 DIAGNOSIS — F332 Major depressive disorder, recurrent severe without psychotic features: Secondary | ICD-10-CM | POA: Diagnosis not present

## 2022-01-24 DIAGNOSIS — F332 Major depressive disorder, recurrent severe without psychotic features: Secondary | ICD-10-CM | POA: Diagnosis not present

## 2022-01-24 DIAGNOSIS — F4323 Adjustment disorder with mixed anxiety and depressed mood: Secondary | ICD-10-CM | POA: Diagnosis not present

## 2022-01-24 DIAGNOSIS — F4310 Post-traumatic stress disorder, unspecified: Secondary | ICD-10-CM | POA: Diagnosis not present

## 2022-02-05 ENCOUNTER — Encounter: Payer: Self-pay | Admitting: Internal Medicine

## 2022-02-07 DIAGNOSIS — F332 Major depressive disorder, recurrent severe without psychotic features: Secondary | ICD-10-CM | POA: Diagnosis not present

## 2022-02-07 DIAGNOSIS — F4310 Post-traumatic stress disorder, unspecified: Secondary | ICD-10-CM | POA: Diagnosis not present

## 2022-02-07 DIAGNOSIS — F4323 Adjustment disorder with mixed anxiety and depressed mood: Secondary | ICD-10-CM | POA: Diagnosis not present

## 2022-02-07 MED ORDER — LANTUS SOLOSTAR 100 UNIT/ML ~~LOC~~ SOPN: 30.0000 [IU] | PEN_INJECTOR | Freq: Every day | SUBCUTANEOUS | 3 refills | Status: AC

## 2022-02-07 MED ORDER — INSULIN PEN NEEDLE 31G X 5 MM MISC: 1.0000 | Freq: Four times a day (QID) | 3 refills | Status: AC

## 2022-03-23 DIAGNOSIS — E161 Other hypoglycemia: Secondary | ICD-10-CM | POA: Diagnosis not present

## 2022-03-27 ENCOUNTER — Encounter: Payer: Self-pay | Admitting: Internal Medicine

## 2022-03-28 NOTE — Telephone Encounter (Signed)
Attempted to call, but no answer.  Will try again.

## 2022-04-02 NOTE — Telephone Encounter (Signed)
Attempted to call patient, but no answer and voicemail not set up to leave message.

## 2022-04-04 NOTE — Telephone Encounter (Signed)
Attempted to call patient again, but no answer and voice mail not set up.

## 2022-04-18 ENCOUNTER — Ambulatory Visit: Payer: BC Managed Care – PPO | Admitting: Internal Medicine

## 2022-04-18 ENCOUNTER — Encounter: Payer: Self-pay | Admitting: Internal Medicine

## 2022-04-18 VITALS — BP 120/80 | HR 86 | Ht 69.0 in | Wt 177.2 lb

## 2022-04-18 DIAGNOSIS — F1020 Alcohol dependence, uncomplicated: Secondary | ICD-10-CM | POA: Insufficient documentation

## 2022-04-18 DIAGNOSIS — E109 Type 1 diabetes mellitus without complications: Secondary | ICD-10-CM | POA: Diagnosis not present

## 2022-04-18 LAB — POCT GLUCOSE (DEVICE FOR HOME USE): POC Glucose: 154 mg/dl — AB (ref 70–99)

## 2022-04-18 LAB — POCT GLYCOSYLATED HEMOGLOBIN (HGB A1C): Hemoglobin A1C: 6.6 % — AB (ref 4.0–5.6)

## 2022-04-18 NOTE — Patient Instructions (Signed)
Continue Lantus 28 units daily  Humalog 1 unit for every 10 grams of carbohydrate with each meal  Humalog correctional insulin: ADD extra units on insulin to your meal-time Humalog dose if your blood sugars are higher than 180. Use the scale below to help guide you:   Blood sugar before meal Number of units to inject  Less than 180 0 unit  181 -  230 1 units  231 -  280 2 units  281 -  330 3 units  331 -  380 4 units  381 -  430 5 units   HOW TO TREAT LOW BLOOD SUGARS (Blood sugar LESS THAN 70 MG/DL) Please follow the RULE OF 15 for the treatment of hypoglycemia treatment (when your (blood sugars are less than 70 mg/dL)   STEP 1: Take 15 grams of carbohydrates when your blood sugar is low, which includes:  3-4 GLUCOSE TABS  OR 3-4 OZ OF JUICE OR REGULAR SODA OR ONE TUBE OF GLUCOSE GEL    STEP 2: RECHECK blood sugar in 15 MINUTES STEP 3: If your blood sugar is still low at the 15 minute recheck --> then, go back to STEP 1 and treat AGAIN with another 15 grams of carbohydrates.

## 2022-04-18 NOTE — Progress Notes (Signed)
Name: Dalton Jackson  Age/ Sex: 26 y.o., male   MRN/ DOB: [id number], 05-Jan-1996     PCP: Loleta Dicker, FNP   Reason for Endocrinology Evaluation: Type 1 Diabetes Mellitus  Initial Endocrine Consultative Visit: 02/06/2018    PATIENT IDENTIFIER: Dalton Jackson is a 26 y.o. male with a past medical history of DM, Arnold-Chiari malformation the patient has followed with Endocrinology clinic since 02/06/2018 for consultative assistance with management of his diabetes.  DIABETIC HISTORY:  Dalton Jackson was diagnosed with DM 2014, he was on OmniPod from 2014-2017, was started on tandem in 2022 His hemoglobin A1c has ranged from 5.9% in 2019, peaking at 12.7% in 2014.   SUBJECTIVE:   During the last visit (07/06/2021): saw Dr. Everardo All  Today (04/18/2022): Dalton Jackson is here for a follow up on diabetes management. He checks his blood sugars depending on how he feels . The patient has  had hypoglycemic episodes since the last clinic visit, which typically occur at night   Pt had assisted hypoglycemia recently  Eats 2 meals  day  Avoids sugar-sweetened beverages    Works at night 7 pm - 7 Am   Pt with chronic nausea and had vomiting last night  Denies diarrhea or constipation   HOME DIABETES REGIMEN:  Humalog 1: 10  TIDQAC Lantus 28 units daily    METER DOWNLOAD SUMMARY: unable to download 122  371  DIABETIC COMPLICATIONS: Microvascular complications:   Denies: neuropathy  Last Eye Exam: Completed unknown   Macrovascular complications:   Denies: CAD, CVA, PVD   HISTORY:  Past Medical History:  Past Medical History:  Diagnosis Date   Alcoholism (HCC)    Arnold-Chiari malformation, type II (HCC)    outgrew   Diabetes mellitus without complication (HCC)    Diabetes mellitus, type II (HCC)    Drug abuse (HCC)    Past Surgical History:  Past Surgical History:  Procedure Laterality Date   TONSILLECTOMY AND ADENOIDECTOMY     Social History:  reports that he has never  smoked. He has never used smokeless tobacco. He reports current alcohol use of about 12.0 standard drinks of alcohol per week. He reports current drug use. Drugs: Hydrocodone, Hydromorphone, and Oxycodone. Family History:  Family History  Problem Relation Age of Onset   Thyroid disease Mother    Hyperlipidemia Mother    Alcohol abuse Paternal Grandfather      HOME MEDICATIONS: Allergies as of 04/18/2022       Reactions   Other Anaphylaxis   Melons   Pertussis Vaccine         Medication List        Accurate as of April 18, 2022  7:03 AM. If you have any questions, ask your nurse or doctor.          acetone (urine) test strip 1 strip by Does not apply route as needed for high blood sugar.   Dexcom G6 Sensor Misc 1 Device by Does not apply route See admin instructions. 1 every 10 days   EPINEPHrine 0.3 mg/0.3 mL Soaj injection Commonly known as: EPI-PEN Inject 0.3 mg into the muscle once. PRN FOR ALLERGIC REACTION   glucagon 1 MG injection Inject 1 mg into the skin.   glucose blood test strip Commonly known as: OneTouch Verio 1 each by Other route 4 (four) times daily. And lancets 1/day   insulin lispro 100 UNIT/ML injection Commonly known as: HumaLOG For use in pump, total of 70 units per day  Insulin Pen Needle 31G X 5 MM Misc 1 Device by Does not apply route in the morning, at noon, in the evening, and at bedtime.   INSULIN SYRINGE 1CC/30GX5/16" 30G X 5/16" 1 ML Misc 1 each by Does not apply route daily as needed.   Lantus SoloStar 100 UNIT/ML Solostar Pen Generic drug: insulin glargine Inject 30 Units into the skin daily.   OneTouch Delica Lancets 33G Misc To check blood sugar daily. DX: E10.9   sertraline 100 MG tablet Commonly known as: ZOLOFT Take by mouth.         OBJECTIVE:   Vital Signs: There were no vitals taken for this visit.  Wt Readings from Last 3 Encounters:  07/06/21 188 lb 9.6 oz (85.5 kg)  03/30/21 179 lb 12.8 oz (81.6  kg)  10/30/20 180 lb 9.6 oz (81.9 kg)     Exam: General: Pt appears well and is in NAD  Neck: General: Supple without adenopathy. Thyroid: Thyroid size normal.  No goiter or nodules appreciated.   Lungs: Clear with good BS bilat   Heart: RRR   Abdomen:  soft, nontender  Extremities: No pretibial edema.   Neuro: MS is good with appropriate affect, pt is alert and Ox3      DATA REVIEWED:  Lab Results  Component Value Date   HGBA1C 6.8 (A) 07/06/2021   HGBA1C 6.7 (A) 03/30/2021   HGBA1C 6.4 (A) 10/30/2020   Lab Results  Component Value Date   LDLCALC 123 (H) 03/17/2013   CREATININE 0.82 03/17/2013   No results found for: "MICRALBCREAT"   Lab Results  Component Value Date   CHOL 190 (H) 03/17/2013   HDL 35 03/17/2013   LDLCALC 123 (H) 03/17/2013   TRIG 160 (H) 03/17/2013   CHOLHDL 5.4 03/17/2013         ASSESSMENT / PLAN / RECOMMENDATIONS:   1) Type 1 Diabetes Mellitus, Optimally controlled, Without complications - Most recent A1c of 6.6 %. Goal A1c < 7.0 %.    - Pump and CGM supplies have been cost prohibitive since there has been a change in insurance -Patient is currently on multiple daily injections of insulin -Unfortunately I am unable to make changes to his insulin regimen due to lack of glucose data.  He endorses hypoglycemia, he did bring a meter that we were unable to download but I tried to review it manually but unfortunately the dates are January and February? -I did explain to the patient I am unable to figure out why he is having hypoglycemic episodes due to lack of glucose data -His in office BG was 154 mg/DL, this is pretty much fasting which indicates that his basal insulin is accurate -He was encouraged to continue to count carbohydrates as accurate as possible -Patient has labs at PCP office, these are not available at this time  MEDICATIONS: Continue Lantus 28 units daily Continue Humalog I:C ratio of 1:10 3 times daily before every  meal Correction factor: Humalog (BG -130/50)  EDUCATION / INSTRUCTIONS: BG monitoring instructions: Patient is instructed to check his blood sugars 3 times a day, before meals. Call [place] [facility] if: BG persistently < 70  I reviewed the Rule of 15 for the treatment of hypoglycemia in detail with the patient. Literature supplied.    2) Diabetic complications:  Eye: Does not have known diabetic retinopathy.  Neuro/ Feet: Does not have known diabetic peripheral neuropathy .  Renal: Patient does not have known baseline CKD. He   is not  on an ACEI/ARB at present.     F/U in 6 months   Signed electronically by: Lyndle Herrlich, MD  [facility] End[facility]  [facility] [address] [place] [place], [place] [address] Phone: [phone] FAX: [phone]   CC: Loleta Dicker, FNP [address] [place] [address] Phone: [phone]  Fax: [phone]  Return to Endocrinology clinic as below: Future Appointments  Date Time Provider Department Center  04/18/2022 12:10 PM Lateefa Crosby, Konrad Dolores, MD [facility] None

## 2022-04-19 ENCOUNTER — Encounter: Payer: Self-pay | Admitting: Internal Medicine

## 2022-04-23 ENCOUNTER — Encounter: Payer: Self-pay | Admitting: Internal Medicine

## 2022-04-24 ENCOUNTER — Other Ambulatory Visit: Payer: Self-pay

## 2022-04-24 MED ORDER — FREESTYLE LIBRE 2 SENSOR MISC: 3 refills | Status: AC

## 2022-05-06 ENCOUNTER — Other Ambulatory Visit: Payer: Self-pay

## 2022-05-06 MED ORDER — INSULIN LISPRO 100 UNIT/ML IJ SOLN: INTRAMUSCULAR | 3 refills | Status: AC

## 2022-06-06 DIAGNOSIS — E1065 Type 1 diabetes mellitus with hyperglycemia: Secondary | ICD-10-CM | POA: Diagnosis not present

## 2022-06-06 DIAGNOSIS — K529 Noninfective gastroenteritis and colitis, unspecified: Secondary | ICD-10-CM | POA: Diagnosis not present

## 2022-06-06 DIAGNOSIS — R112 Nausea with vomiting, unspecified: Secondary | ICD-10-CM | POA: Diagnosis not present

## 2022-07-02 ENCOUNTER — Ambulatory Visit: Payer: Self-pay | Admitting: Internal Medicine

## 2022-10-29 ENCOUNTER — Ambulatory Visit: Payer: BC Managed Care – PPO | Admitting: Internal Medicine

## 2022-10-29 NOTE — Progress Notes (Deleted)
Name: Dalton Jackson  Age/ Sex: 27 y.o., male   MRN/ DOB: [id number], 1995/09/12     PCP: Loleta Dicker, FNP   Reason for Endocrinology Evaluation: Type 1 Diabetes Mellitus  Initial Endocrine Consultative Visit: 02/06/2018    PATIENT IDENTIFIER: Dalton Jackson is a 27 y.o. male with a past medical history of DM, Arnold-Chiari malformation the patient has followed with Endocrinology clinic since 02/06/2018 for consultative assistance with management of his diabetes.  DIABETIC HISTORY:  Dalton Jackson was diagnosed with DM 2014, he was on OmniPod from 2014-2017, was started on tandem in 2022 His hemoglobin A1c has ranged from 5.9% in 2019, peaking at 12.7% in 2014.   SUBJECTIVE:   During the last visit (04/18/2022): A1c 6.6%  Today (10/29/2022): Dalton Jackson is here for a follow up on diabetes management. He checks his blood sugars depending on how he feels . The patient has  had hypoglycemic episodes since the last clinic visit, which typically occur at night     Works at night 7 pm - 7 Am   Pt with chronic nausea and had vomiting last night  Denies diarrhea or constipation   HOME DIABETES REGIMEN:  Humalog 1: 10  TIDQAC Lantus 28 units daily  Correction factor: Humalog (BG -130/50)     METER DOWNLOAD SUMMARY: unable to download 122  371  DIABETIC COMPLICATIONS: Microvascular complications:   Denies: neuropathy  Last Eye Exam: Completed unknown   Macrovascular complications:   Denies: CAD, CVA, PVD   HISTORY:  Past Medical History:  Past Medical History:  Diagnosis Date   Arnold-Chiari malformation, type II (HCC)    outgrew   Diabetes mellitus without complication (HCC)    Diabetes mellitus, type II (HCC)    Drug abuse (HCC)    Past Surgical History:  Past Surgical History:  Procedure Laterality Date   TONSILLECTOMY AND ADENOIDECTOMY     Social History:  reports that he has never smoked. He has never used smokeless tobacco. He reports current alcohol use of  about 12.0 standard drinks of alcohol per week. He reports current drug use. Drugs: Hydrocodone, Hydromorphone, and Oxycodone. Family History:  Family History  Problem Relation Age of Onset   Thyroid disease Mother    Hyperlipidemia Mother    Alcohol abuse Paternal Grandfather      HOME MEDICATIONS: Allergies as of 10/29/2022       Reactions   Other Anaphylaxis   Melons   Pertussis Vaccine         Medication List        Accurate as of Oct 29, 2022  7:23 AM. If you have any questions, ask your nurse or doctor.          acetone (urine) test strip 1 strip by Does not apply route as needed for high blood sugar.   EPINEPHrine 0.3 mg/0.3 mL Soaj injection Commonly known as: EPI-PEN Inject 0.3 mg into the muscle once. PRN FOR ALLERGIC REACTION   FreeStyle Libre 2 Sensor Misc Change sensors every 14 days.   glucagon 1 MG injection Inject 1 mg into the skin.   glucose blood test strip Commonly known as: OneTouch Verio 1 each by Other route 4 (four) times daily. And lancets 1/day   insulin lispro 100 UNIT/ML injection Commonly known as: HumaLOG For use in pump, total of 70 units per day   Insulin Pen Needle 31G X 5 MM Misc 1 Device by Does not apply route in the morning, at noon, in  the evening, and at bedtime.   INSULIN SYRINGE 1CC/30GX5/16" 30G X 5/16" 1 ML Misc 1 each by Does not apply route daily as needed.   Lantus SoloStar 100 UNIT/ML Solostar Pen Generic drug: insulin glargine Inject 30 Units into the skin daily. What changed: how much to take   OneTouch Delica Lancets 33G Misc To check blood sugar daily. DX: E10.9   sertraline 100 MG tablet Commonly known as: ZOLOFT Take by mouth.         OBJECTIVE:   Vital Signs: There were no vitals taken for this visit.  Wt Readings from Last 3 Encounters:  04/18/22 177 lb 3.2 oz (80.4 kg)  07/06/21 188 lb 9.6 oz (85.5 kg)  03/30/21 179 lb 12.8 oz (81.6 kg)     Exam: General: Pt appears well and is  in NAD  Neck: General: Supple without adenopathy. Thyroid: Thyroid size normal.  No goiter or nodules appreciated.   Lungs: Clear with good BS bilat   Heart: RRR   Abdomen:  soft, nontender  Extremities: No pretibial edema.   Neuro: MS is good with appropriate affect, pt is alert and Ox3      DATA REVIEWED:  Lab Results  Component Value Date   HGBA1C 6.6 (A) 04/18/2022   HGBA1C 6.8 (A) 07/06/2021   HGBA1C 6.7 (A) 03/30/2021   Lab Results  Component Value Date   LDLCALC 123 (H) 03/17/2013   CREATININE 0.82 03/17/2013   No results found for: "MICRALBCREAT"   Lab Results  Component Value Date   CHOL 190 (H) 03/17/2013   HDL 35 03/17/2013   LDLCALC 123 (H) 03/17/2013   TRIG 160 (H) 03/17/2013   CHOLHDL 5.4 03/17/2013         ASSESSMENT / PLAN / RECOMMENDATIONS:   1) Type 1 Diabetes Mellitus, Optimally controlled, Without complications - Most recent A1c of 6.6 %. Goal A1c < 7.0 %.    - Pump and CGM supplies have been cost prohibitive since there has been a change in insurance -Patient is currently on multiple daily injections of insulin -Unfortunately I am unable to make changes to his insulin regimen due to lack of glucose data.  He endorses hypoglycemia, he did bring a meter that we were unable to download but I tried to review it manually but unfortunately the dates are January and February? -I did explain to the patient I am unable to figure out why he is having hypoglycemic episodes due to lack of glucose data -His in office BG was 154 mg/DL, this is pretty much fasting which indicates that his basal insulin is accurate -He was encouraged to continue to count carbohydrates as accurate as possible -Patient has labs at PCP office, these are not available at this time  MEDICATIONS: Continue Lantus 28 units daily Continue Humalog I:C ratio of 1:10 3 times daily before every meal Correction factor: Humalog (BG -130/50)  EDUCATION / INSTRUCTIONS: BG monitoring  instructions: Patient is instructed to check his blood sugars 3 times a day, before meals. Call [place] [facility] clinic if: BG persistently < 70  I reviewed the Rule of 15 for the treatment of hypoglycemia in detail with the patient. Literature supplied.    2) Diabetic complications:  Eye: Does not have known diabetic retinopathy.  Neuro/ Feet: Does not have known diabetic peripheral neuropathy .  Renal: Patient does not have known baseline CKD. He   is not on an ACEI/ARB at present.     F/U in 6 months  Signed electronically by: Lyndle Herrlich, MD  [facility]  [facility] [address] [place] [place], [place] [address] Phone: [phone] FAX: [phone]   CC: Loleta Dicker, FNP [address] [place] [address] Phone: [phone]  Fax: [phone]  Return to Endocrinology clinic as below: Future Appointments  Date Time Provider Department Center  10/29/2022  3:00 PM Alexes Lamarque, Konrad Dolores, MD [facility] None
